# Patient Record
Sex: Male | Born: 1940 | Race: White | Hispanic: No | Smoking: Former smoker
Health system: Southern US, Community
[De-identification: ages and names within clinical notes are randomized; demographics above are authoritative.]

## PROBLEM LIST (undated history)

## (undated) DIAGNOSIS — C801 Malignant (primary) neoplasm, unspecified: Secondary | ICD-10-CM

## (undated) DIAGNOSIS — E119 Type 2 diabetes mellitus without complications: Secondary | ICD-10-CM

---

## 2014-03-08 DIAGNOSIS — E119 Type 2 diabetes mellitus without complications: Secondary | ICD-10-CM | POA: Diagnosis not present

## 2014-03-08 DIAGNOSIS — H2513 Age-related nuclear cataract, bilateral: Secondary | ICD-10-CM | POA: Diagnosis not present

## 2014-03-08 DIAGNOSIS — H2512 Age-related nuclear cataract, left eye: Secondary | ICD-10-CM | POA: Diagnosis not present

## 2014-03-18 DIAGNOSIS — H2512 Age-related nuclear cataract, left eye: Secondary | ICD-10-CM | POA: Diagnosis not present

## 2014-04-17 DIAGNOSIS — H2511 Age-related nuclear cataract, right eye: Secondary | ICD-10-CM | POA: Diagnosis not present

## 2014-04-17 DIAGNOSIS — H25011 Cortical age-related cataract, right eye: Secondary | ICD-10-CM | POA: Diagnosis not present

## 2014-04-18 DIAGNOSIS — L718 Other rosacea: Secondary | ICD-10-CM | POA: Diagnosis not present

## 2014-05-20 DIAGNOSIS — H2511 Age-related nuclear cataract, right eye: Secondary | ICD-10-CM | POA: Diagnosis not present

## 2014-06-25 DIAGNOSIS — M179 Osteoarthritis of knee, unspecified: Secondary | ICD-10-CM | POA: Diagnosis not present

## 2014-07-09 DIAGNOSIS — G6289 Other specified polyneuropathies: Secondary | ICD-10-CM | POA: Diagnosis not present

## 2014-07-09 DIAGNOSIS — I10 Essential (primary) hypertension: Secondary | ICD-10-CM | POA: Diagnosis not present

## 2014-07-09 DIAGNOSIS — E1165 Type 2 diabetes mellitus with hyperglycemia: Secondary | ICD-10-CM | POA: Diagnosis not present

## 2014-07-09 DIAGNOSIS — D519 Vitamin B12 deficiency anemia, unspecified: Secondary | ICD-10-CM | POA: Diagnosis not present

## 2014-07-14 DIAGNOSIS — D519 Vitamin B12 deficiency anemia, unspecified: Secondary | ICD-10-CM | POA: Diagnosis not present

## 2014-07-14 DIAGNOSIS — Z1389 Encounter for screening for other disorder: Secondary | ICD-10-CM | POA: Diagnosis not present

## 2014-07-14 DIAGNOSIS — E1165 Type 2 diabetes mellitus with hyperglycemia: Secondary | ICD-10-CM | POA: Diagnosis not present

## 2014-11-11 DIAGNOSIS — J019 Acute sinusitis, unspecified: Secondary | ICD-10-CM | POA: Diagnosis not present

## 2014-11-11 DIAGNOSIS — E1165 Type 2 diabetes mellitus with hyperglycemia: Secondary | ICD-10-CM | POA: Diagnosis not present

## 2014-11-11 DIAGNOSIS — I10 Essential (primary) hypertension: Secondary | ICD-10-CM | POA: Diagnosis not present

## 2014-11-25 DIAGNOSIS — L609 Nail disorder, unspecified: Secondary | ICD-10-CM | POA: Diagnosis not present

## 2014-11-25 DIAGNOSIS — E1140 Type 2 diabetes mellitus with diabetic neuropathy, unspecified: Secondary | ICD-10-CM | POA: Diagnosis not present

## 2014-11-25 DIAGNOSIS — L110 Acquired keratosis follicularis: Secondary | ICD-10-CM | POA: Diagnosis not present

## 2014-12-30 DIAGNOSIS — D519 Vitamin B12 deficiency anemia, unspecified: Secondary | ICD-10-CM | POA: Diagnosis not present

## 2014-12-30 DIAGNOSIS — Z0000 Encounter for general adult medical examination without abnormal findings: Secondary | ICD-10-CM | POA: Diagnosis not present

## 2014-12-30 DIAGNOSIS — L739 Follicular disorder, unspecified: Secondary | ICD-10-CM | POA: Diagnosis not present

## 2014-12-30 DIAGNOSIS — E1165 Type 2 diabetes mellitus with hyperglycemia: Secondary | ICD-10-CM | POA: Diagnosis not present

## 2014-12-30 DIAGNOSIS — G6289 Other specified polyneuropathies: Secondary | ICD-10-CM | POA: Diagnosis not present

## 2015-01-01 DIAGNOSIS — M179 Osteoarthritis of knee, unspecified: Secondary | ICD-10-CM | POA: Diagnosis not present

## 2015-05-18 DIAGNOSIS — M179 Osteoarthritis of knee, unspecified: Secondary | ICD-10-CM | POA: Diagnosis not present

## 2015-06-17 DIAGNOSIS — E1165 Type 2 diabetes mellitus with hyperglycemia: Secondary | ICD-10-CM | POA: Diagnosis not present

## 2015-06-17 DIAGNOSIS — D519 Vitamin B12 deficiency anemia, unspecified: Secondary | ICD-10-CM | POA: Diagnosis not present

## 2015-06-17 DIAGNOSIS — I10 Essential (primary) hypertension: Secondary | ICD-10-CM | POA: Diagnosis not present

## 2015-06-24 DIAGNOSIS — I10 Essential (primary) hypertension: Secondary | ICD-10-CM | POA: Diagnosis not present

## 2015-06-24 DIAGNOSIS — E1165 Type 2 diabetes mellitus with hyperglycemia: Secondary | ICD-10-CM | POA: Diagnosis not present

## 2015-06-24 DIAGNOSIS — D519 Vitamin B12 deficiency anemia, unspecified: Secondary | ICD-10-CM | POA: Diagnosis not present

## 2015-10-07 DIAGNOSIS — R3916 Straining to void: Secondary | ICD-10-CM | POA: Diagnosis not present

## 2015-10-07 DIAGNOSIS — R972 Elevated prostate specific antigen [PSA]: Secondary | ICD-10-CM | POA: Diagnosis not present

## 2015-10-07 DIAGNOSIS — R3912 Poor urinary stream: Secondary | ICD-10-CM | POA: Diagnosis not present

## 2015-11-10 DIAGNOSIS — R972 Elevated prostate specific antigen [PSA]: Secondary | ICD-10-CM | POA: Diagnosis not present

## 2015-11-20 DIAGNOSIS — R3912 Poor urinary stream: Secondary | ICD-10-CM | POA: Diagnosis not present

## 2015-11-20 DIAGNOSIS — R972 Elevated prostate specific antigen [PSA]: Secondary | ICD-10-CM | POA: Diagnosis not present

## 2015-11-24 DIAGNOSIS — E1140 Type 2 diabetes mellitus with diabetic neuropathy, unspecified: Secondary | ICD-10-CM | POA: Diagnosis not present

## 2015-11-24 DIAGNOSIS — L110 Acquired keratosis follicularis: Secondary | ICD-10-CM | POA: Diagnosis not present

## 2015-11-24 DIAGNOSIS — L609 Nail disorder, unspecified: Secondary | ICD-10-CM | POA: Diagnosis not present

## 2016-01-21 DIAGNOSIS — C61 Malignant neoplasm of prostate: Secondary | ICD-10-CM | POA: Diagnosis not present

## 2016-01-21 DIAGNOSIS — R972 Elevated prostate specific antigen [PSA]: Secondary | ICD-10-CM | POA: Diagnosis not present

## 2016-02-03 DIAGNOSIS — L110 Acquired keratosis follicularis: Secondary | ICD-10-CM | POA: Diagnosis not present

## 2016-02-03 DIAGNOSIS — L609 Nail disorder, unspecified: Secondary | ICD-10-CM | POA: Diagnosis not present

## 2016-02-03 DIAGNOSIS — E1140 Type 2 diabetes mellitus with diabetic neuropathy, unspecified: Secondary | ICD-10-CM | POA: Diagnosis not present

## 2016-02-04 DIAGNOSIS — R972 Elevated prostate specific antigen [PSA]: Secondary | ICD-10-CM | POA: Diagnosis not present

## 2016-02-04 DIAGNOSIS — C61 Malignant neoplasm of prostate: Secondary | ICD-10-CM | POA: Diagnosis not present

## 2016-02-12 DIAGNOSIS — D519 Vitamin B12 deficiency anemia, unspecified: Secondary | ICD-10-CM | POA: Diagnosis not present

## 2016-02-12 DIAGNOSIS — I10 Essential (primary) hypertension: Secondary | ICD-10-CM | POA: Diagnosis not present

## 2016-02-12 DIAGNOSIS — E1165 Type 2 diabetes mellitus with hyperglycemia: Secondary | ICD-10-CM | POA: Diagnosis not present

## 2016-02-12 DIAGNOSIS — G6289 Other specified polyneuropathies: Secondary | ICD-10-CM | POA: Diagnosis not present

## 2016-02-16 DIAGNOSIS — Z23 Encounter for immunization: Secondary | ICD-10-CM | POA: Diagnosis not present

## 2016-02-16 DIAGNOSIS — Z0000 Encounter for general adult medical examination without abnormal findings: Secondary | ICD-10-CM | POA: Diagnosis not present

## 2016-02-17 DIAGNOSIS — L729 Follicular cyst of the skin and subcutaneous tissue, unspecified: Secondary | ICD-10-CM | POA: Diagnosis not present

## 2016-02-19 DIAGNOSIS — L989 Disorder of the skin and subcutaneous tissue, unspecified: Secondary | ICD-10-CM | POA: Diagnosis not present

## 2016-02-19 DIAGNOSIS — C4442 Squamous cell carcinoma of skin of scalp and neck: Secondary | ICD-10-CM | POA: Diagnosis not present

## 2016-02-26 DIAGNOSIS — C61 Malignant neoplasm of prostate: Secondary | ICD-10-CM | POA: Diagnosis not present

## 2016-03-03 DIAGNOSIS — Z7983 Long term (current) use of bisphosphonates: Secondary | ICD-10-CM | POA: Diagnosis not present

## 2016-03-03 DIAGNOSIS — Z8042 Family history of malignant neoplasm of prostate: Secondary | ICD-10-CM | POA: Diagnosis not present

## 2016-03-03 DIAGNOSIS — Z800 Family history of malignant neoplasm of digestive organs: Secondary | ICD-10-CM | POA: Diagnosis not present

## 2016-03-03 DIAGNOSIS — Z79899 Other long term (current) drug therapy: Secondary | ICD-10-CM | POA: Diagnosis not present

## 2016-03-03 DIAGNOSIS — E119 Type 2 diabetes mellitus without complications: Secondary | ICD-10-CM | POA: Diagnosis not present

## 2016-03-03 DIAGNOSIS — Z87891 Personal history of nicotine dependence: Secondary | ICD-10-CM | POA: Diagnosis not present

## 2016-03-03 DIAGNOSIS — G4733 Obstructive sleep apnea (adult) (pediatric): Secondary | ICD-10-CM | POA: Diagnosis not present

## 2016-03-03 DIAGNOSIS — C61 Malignant neoplasm of prostate: Secondary | ICD-10-CM | POA: Diagnosis not present

## 2016-03-03 DIAGNOSIS — Z7984 Long term (current) use of oral hypoglycemic drugs: Secondary | ICD-10-CM | POA: Diagnosis not present

## 2016-03-09 DIAGNOSIS — C61 Malignant neoplasm of prostate: Secondary | ICD-10-CM | POA: Diagnosis not present

## 2016-03-29 DIAGNOSIS — C61 Malignant neoplasm of prostate: Secondary | ICD-10-CM | POA: Diagnosis not present

## 2016-04-06 DIAGNOSIS — C61 Malignant neoplasm of prostate: Secondary | ICD-10-CM | POA: Diagnosis not present

## 2016-04-12 DIAGNOSIS — C61 Malignant neoplasm of prostate: Secondary | ICD-10-CM | POA: Diagnosis not present

## 2016-04-12 DIAGNOSIS — Z510 Encounter for antineoplastic radiation therapy: Secondary | ICD-10-CM | POA: Diagnosis not present

## 2016-04-14 DIAGNOSIS — C61 Malignant neoplasm of prostate: Secondary | ICD-10-CM | POA: Diagnosis not present

## 2016-04-14 DIAGNOSIS — Z510 Encounter for antineoplastic radiation therapy: Secondary | ICD-10-CM | POA: Diagnosis not present

## 2016-04-15 DIAGNOSIS — C61 Malignant neoplasm of prostate: Secondary | ICD-10-CM | POA: Diagnosis not present

## 2016-04-15 DIAGNOSIS — Z510 Encounter for antineoplastic radiation therapy: Secondary | ICD-10-CM | POA: Diagnosis not present

## 2016-04-18 DIAGNOSIS — C61 Malignant neoplasm of prostate: Secondary | ICD-10-CM | POA: Diagnosis not present

## 2016-04-18 DIAGNOSIS — Z510 Encounter for antineoplastic radiation therapy: Secondary | ICD-10-CM | POA: Diagnosis not present

## 2016-04-19 DIAGNOSIS — Z510 Encounter for antineoplastic radiation therapy: Secondary | ICD-10-CM | POA: Diagnosis not present

## 2016-04-19 DIAGNOSIS — C61 Malignant neoplasm of prostate: Secondary | ICD-10-CM | POA: Diagnosis not present

## 2016-04-20 DIAGNOSIS — Z510 Encounter for antineoplastic radiation therapy: Secondary | ICD-10-CM | POA: Diagnosis not present

## 2016-04-20 DIAGNOSIS — C61 Malignant neoplasm of prostate: Secondary | ICD-10-CM | POA: Diagnosis not present

## 2016-04-20 DIAGNOSIS — L609 Nail disorder, unspecified: Secondary | ICD-10-CM | POA: Diagnosis not present

## 2016-04-20 DIAGNOSIS — L110 Acquired keratosis follicularis: Secondary | ICD-10-CM | POA: Diagnosis not present

## 2016-04-20 DIAGNOSIS — E1140 Type 2 diabetes mellitus with diabetic neuropathy, unspecified: Secondary | ICD-10-CM | POA: Diagnosis not present

## 2016-04-21 DIAGNOSIS — Z510 Encounter for antineoplastic radiation therapy: Secondary | ICD-10-CM | POA: Diagnosis not present

## 2016-04-21 DIAGNOSIS — C61 Malignant neoplasm of prostate: Secondary | ICD-10-CM | POA: Diagnosis not present

## 2016-04-22 DIAGNOSIS — C61 Malignant neoplasm of prostate: Secondary | ICD-10-CM | POA: Diagnosis not present

## 2016-04-22 DIAGNOSIS — Z510 Encounter for antineoplastic radiation therapy: Secondary | ICD-10-CM | POA: Diagnosis not present

## 2016-04-25 DIAGNOSIS — C61 Malignant neoplasm of prostate: Secondary | ICD-10-CM | POA: Diagnosis not present

## 2016-04-25 DIAGNOSIS — Z510 Encounter for antineoplastic radiation therapy: Secondary | ICD-10-CM | POA: Diagnosis not present

## 2016-04-26 DIAGNOSIS — C61 Malignant neoplasm of prostate: Secondary | ICD-10-CM | POA: Diagnosis not present

## 2016-04-26 DIAGNOSIS — Z510 Encounter for antineoplastic radiation therapy: Secondary | ICD-10-CM | POA: Diagnosis not present

## 2016-04-26 DIAGNOSIS — H04123 Dry eye syndrome of bilateral lacrimal glands: Secondary | ICD-10-CM | POA: Diagnosis not present

## 2016-04-27 DIAGNOSIS — C61 Malignant neoplasm of prostate: Secondary | ICD-10-CM | POA: Diagnosis not present

## 2016-04-27 DIAGNOSIS — Z510 Encounter for antineoplastic radiation therapy: Secondary | ICD-10-CM | POA: Diagnosis not present

## 2016-04-28 DIAGNOSIS — Z510 Encounter for antineoplastic radiation therapy: Secondary | ICD-10-CM | POA: Diagnosis not present

## 2016-04-28 DIAGNOSIS — C61 Malignant neoplasm of prostate: Secondary | ICD-10-CM | POA: Diagnosis not present

## 2016-04-29 DIAGNOSIS — Z510 Encounter for antineoplastic radiation therapy: Secondary | ICD-10-CM | POA: Diagnosis not present

## 2016-04-29 DIAGNOSIS — C61 Malignant neoplasm of prostate: Secondary | ICD-10-CM | POA: Diagnosis not present

## 2016-05-02 DIAGNOSIS — C61 Malignant neoplasm of prostate: Secondary | ICD-10-CM | POA: Diagnosis not present

## 2016-05-02 DIAGNOSIS — Z510 Encounter for antineoplastic radiation therapy: Secondary | ICD-10-CM | POA: Diagnosis not present

## 2016-05-03 DIAGNOSIS — Z510 Encounter for antineoplastic radiation therapy: Secondary | ICD-10-CM | POA: Diagnosis not present

## 2016-05-03 DIAGNOSIS — C61 Malignant neoplasm of prostate: Secondary | ICD-10-CM | POA: Diagnosis not present

## 2016-05-04 DIAGNOSIS — Z510 Encounter for antineoplastic radiation therapy: Secondary | ICD-10-CM | POA: Diagnosis not present

## 2016-05-04 DIAGNOSIS — C61 Malignant neoplasm of prostate: Secondary | ICD-10-CM | POA: Diagnosis not present

## 2016-05-05 DIAGNOSIS — Z510 Encounter for antineoplastic radiation therapy: Secondary | ICD-10-CM | POA: Diagnosis not present

## 2016-05-05 DIAGNOSIS — C61 Malignant neoplasm of prostate: Secondary | ICD-10-CM | POA: Diagnosis not present

## 2016-05-06 DIAGNOSIS — Z510 Encounter for antineoplastic radiation therapy: Secondary | ICD-10-CM | POA: Diagnosis not present

## 2016-05-06 DIAGNOSIS — C61 Malignant neoplasm of prostate: Secondary | ICD-10-CM | POA: Diagnosis not present

## 2016-05-09 DIAGNOSIS — Z510 Encounter for antineoplastic radiation therapy: Secondary | ICD-10-CM | POA: Diagnosis not present

## 2016-05-09 DIAGNOSIS — C61 Malignant neoplasm of prostate: Secondary | ICD-10-CM | POA: Diagnosis not present

## 2016-05-10 DIAGNOSIS — Z510 Encounter for antineoplastic radiation therapy: Secondary | ICD-10-CM | POA: Diagnosis not present

## 2016-05-10 DIAGNOSIS — C61 Malignant neoplasm of prostate: Secondary | ICD-10-CM | POA: Diagnosis not present

## 2016-05-11 DIAGNOSIS — C61 Malignant neoplasm of prostate: Secondary | ICD-10-CM | POA: Diagnosis not present

## 2016-05-11 DIAGNOSIS — Z510 Encounter for antineoplastic radiation therapy: Secondary | ICD-10-CM | POA: Diagnosis not present

## 2016-05-12 DIAGNOSIS — Z510 Encounter for antineoplastic radiation therapy: Secondary | ICD-10-CM | POA: Diagnosis not present

## 2016-05-12 DIAGNOSIS — C61 Malignant neoplasm of prostate: Secondary | ICD-10-CM | POA: Diagnosis not present

## 2016-05-13 DIAGNOSIS — C61 Malignant neoplasm of prostate: Secondary | ICD-10-CM | POA: Diagnosis not present

## 2016-05-13 DIAGNOSIS — Z510 Encounter for antineoplastic radiation therapy: Secondary | ICD-10-CM | POA: Diagnosis not present

## 2016-05-16 DIAGNOSIS — Z510 Encounter for antineoplastic radiation therapy: Secondary | ICD-10-CM | POA: Diagnosis not present

## 2016-05-16 DIAGNOSIS — C61 Malignant neoplasm of prostate: Secondary | ICD-10-CM | POA: Diagnosis not present

## 2016-05-17 DIAGNOSIS — Z510 Encounter for antineoplastic radiation therapy: Secondary | ICD-10-CM | POA: Diagnosis not present

## 2016-05-17 DIAGNOSIS — C61 Malignant neoplasm of prostate: Secondary | ICD-10-CM | POA: Diagnosis not present

## 2016-05-18 DIAGNOSIS — C61 Malignant neoplasm of prostate: Secondary | ICD-10-CM | POA: Diagnosis not present

## 2016-05-18 DIAGNOSIS — Z510 Encounter for antineoplastic radiation therapy: Secondary | ICD-10-CM | POA: Diagnosis not present

## 2016-05-19 DIAGNOSIS — Z510 Encounter for antineoplastic radiation therapy: Secondary | ICD-10-CM | POA: Diagnosis not present

## 2016-05-19 DIAGNOSIS — C61 Malignant neoplasm of prostate: Secondary | ICD-10-CM | POA: Diagnosis not present

## 2016-05-20 DIAGNOSIS — Z510 Encounter for antineoplastic radiation therapy: Secondary | ICD-10-CM | POA: Diagnosis not present

## 2016-05-20 DIAGNOSIS — C61 Malignant neoplasm of prostate: Secondary | ICD-10-CM | POA: Diagnosis not present

## 2016-05-23 DIAGNOSIS — Z510 Encounter for antineoplastic radiation therapy: Secondary | ICD-10-CM | POA: Diagnosis not present

## 2016-05-23 DIAGNOSIS — C61 Malignant neoplasm of prostate: Secondary | ICD-10-CM | POA: Diagnosis not present

## 2016-05-24 DIAGNOSIS — Z510 Encounter for antineoplastic radiation therapy: Secondary | ICD-10-CM | POA: Diagnosis not present

## 2016-05-24 DIAGNOSIS — C61 Malignant neoplasm of prostate: Secondary | ICD-10-CM | POA: Diagnosis not present

## 2016-05-25 DIAGNOSIS — C61 Malignant neoplasm of prostate: Secondary | ICD-10-CM | POA: Diagnosis not present

## 2016-05-25 DIAGNOSIS — M179 Osteoarthritis of knee, unspecified: Secondary | ICD-10-CM | POA: Diagnosis not present

## 2016-05-25 DIAGNOSIS — Z510 Encounter for antineoplastic radiation therapy: Secondary | ICD-10-CM | POA: Diagnosis not present

## 2016-05-25 DIAGNOSIS — M25561 Pain in right knee: Secondary | ICD-10-CM | POA: Diagnosis not present

## 2016-05-26 DIAGNOSIS — Z510 Encounter for antineoplastic radiation therapy: Secondary | ICD-10-CM | POA: Diagnosis not present

## 2016-05-26 DIAGNOSIS — C61 Malignant neoplasm of prostate: Secondary | ICD-10-CM | POA: Diagnosis not present

## 2016-05-27 DIAGNOSIS — Z510 Encounter for antineoplastic radiation therapy: Secondary | ICD-10-CM | POA: Diagnosis not present

## 2016-05-27 DIAGNOSIS — C61 Malignant neoplasm of prostate: Secondary | ICD-10-CM | POA: Diagnosis not present

## 2016-05-30 DIAGNOSIS — Z510 Encounter for antineoplastic radiation therapy: Secondary | ICD-10-CM | POA: Diagnosis not present

## 2016-05-30 DIAGNOSIS — C61 Malignant neoplasm of prostate: Secondary | ICD-10-CM | POA: Diagnosis not present

## 2016-06-01 DIAGNOSIS — C61 Malignant neoplasm of prostate: Secondary | ICD-10-CM | POA: Diagnosis not present

## 2016-06-01 DIAGNOSIS — Z510 Encounter for antineoplastic radiation therapy: Secondary | ICD-10-CM | POA: Diagnosis not present

## 2016-06-02 DIAGNOSIS — C61 Malignant neoplasm of prostate: Secondary | ICD-10-CM | POA: Diagnosis not present

## 2016-06-02 DIAGNOSIS — Z510 Encounter for antineoplastic radiation therapy: Secondary | ICD-10-CM | POA: Diagnosis not present

## 2016-06-06 DIAGNOSIS — C61 Malignant neoplasm of prostate: Secondary | ICD-10-CM | POA: Diagnosis not present

## 2016-06-06 DIAGNOSIS — Z510 Encounter for antineoplastic radiation therapy: Secondary | ICD-10-CM | POA: Diagnosis not present

## 2016-06-07 DIAGNOSIS — C61 Malignant neoplasm of prostate: Secondary | ICD-10-CM | POA: Diagnosis not present

## 2016-06-07 DIAGNOSIS — Z510 Encounter for antineoplastic radiation therapy: Secondary | ICD-10-CM | POA: Diagnosis not present

## 2016-06-08 DIAGNOSIS — Z510 Encounter for antineoplastic radiation therapy: Secondary | ICD-10-CM | POA: Diagnosis not present

## 2016-06-08 DIAGNOSIS — C61 Malignant neoplasm of prostate: Secondary | ICD-10-CM | POA: Diagnosis not present

## 2016-07-08 DIAGNOSIS — Z923 Personal history of irradiation: Secondary | ICD-10-CM | POA: Diagnosis not present

## 2016-07-08 DIAGNOSIS — C61 Malignant neoplasm of prostate: Secondary | ICD-10-CM | POA: Diagnosis not present

## 2016-07-13 ENCOUNTER — Ambulatory Visit (INDEPENDENT_AMBULATORY_CARE_PROVIDER_SITE_OTHER): Payer: Medicare Other | Admitting: Urology

## 2016-07-13 DIAGNOSIS — C61 Malignant neoplasm of prostate: Secondary | ICD-10-CM | POA: Diagnosis not present

## 2016-07-13 DIAGNOSIS — R351 Nocturia: Secondary | ICD-10-CM | POA: Diagnosis not present

## 2016-10-26 DIAGNOSIS — C61 Malignant neoplasm of prostate: Secondary | ICD-10-CM | POA: Diagnosis not present

## 2016-11-08 DIAGNOSIS — C61 Malignant neoplasm of prostate: Secondary | ICD-10-CM | POA: Insufficient documentation

## 2016-11-09 DIAGNOSIS — Z923 Personal history of irradiation: Secondary | ICD-10-CM | POA: Diagnosis not present

## 2016-11-09 DIAGNOSIS — C61 Malignant neoplasm of prostate: Secondary | ICD-10-CM | POA: Diagnosis not present

## 2017-01-03 DIAGNOSIS — M1711 Unilateral primary osteoarthritis, right knee: Secondary | ICD-10-CM | POA: Insufficient documentation

## 2017-01-25 ENCOUNTER — Ambulatory Visit: Payer: Medicare Other | Admitting: Urology

## 2017-01-25 DIAGNOSIS — N5201 Erectile dysfunction due to arterial insufficiency: Secondary | ICD-10-CM

## 2017-01-25 DIAGNOSIS — C61 Malignant neoplasm of prostate: Secondary | ICD-10-CM

## 2017-01-25 DIAGNOSIS — R351 Nocturia: Secondary | ICD-10-CM | POA: Diagnosis not present

## 2017-04-21 DIAGNOSIS — D519 Vitamin B12 deficiency anemia, unspecified: Secondary | ICD-10-CM | POA: Diagnosis not present

## 2017-04-21 DIAGNOSIS — C61 Malignant neoplasm of prostate: Secondary | ICD-10-CM | POA: Diagnosis not present

## 2017-04-21 DIAGNOSIS — I10 Essential (primary) hypertension: Secondary | ICD-10-CM | POA: Diagnosis not present

## 2017-04-21 DIAGNOSIS — L739 Follicular disorder, unspecified: Secondary | ICD-10-CM | POA: Diagnosis not present

## 2017-04-21 DIAGNOSIS — E1165 Type 2 diabetes mellitus with hyperglycemia: Secondary | ICD-10-CM | POA: Diagnosis not present

## 2017-04-26 DIAGNOSIS — Z0001 Encounter for general adult medical examination with abnormal findings: Secondary | ICD-10-CM | POA: Diagnosis not present

## 2017-04-26 DIAGNOSIS — I10 Essential (primary) hypertension: Secondary | ICD-10-CM | POA: Diagnosis not present

## 2017-04-26 DIAGNOSIS — E1165 Type 2 diabetes mellitus with hyperglycemia: Secondary | ICD-10-CM | POA: Diagnosis not present

## 2017-04-26 DIAGNOSIS — C61 Malignant neoplasm of prostate: Secondary | ICD-10-CM | POA: Diagnosis not present

## 2017-05-03 ENCOUNTER — Ambulatory Visit: Payer: Medicare Other | Admitting: Urology

## 2017-05-03 DIAGNOSIS — R351 Nocturia: Secondary | ICD-10-CM

## 2017-05-03 DIAGNOSIS — C61 Malignant neoplasm of prostate: Secondary | ICD-10-CM | POA: Diagnosis not present

## 2017-05-03 DIAGNOSIS — N5201 Erectile dysfunction due to arterial insufficiency: Secondary | ICD-10-CM

## 2017-05-12 DIAGNOSIS — C61 Malignant neoplasm of prostate: Secondary | ICD-10-CM | POA: Diagnosis not present

## 2017-05-12 DIAGNOSIS — Z923 Personal history of irradiation: Secondary | ICD-10-CM | POA: Diagnosis not present

## 2017-07-12 ENCOUNTER — Ambulatory Visit: Payer: Medicare Other | Admitting: Urology

## 2017-10-24 DIAGNOSIS — L720 Epidermal cyst: Secondary | ICD-10-CM | POA: Diagnosis not present

## 2017-12-12 DIAGNOSIS — I10 Essential (primary) hypertension: Secondary | ICD-10-CM | POA: Diagnosis not present

## 2017-12-12 DIAGNOSIS — D519 Vitamin B12 deficiency anemia, unspecified: Secondary | ICD-10-CM | POA: Diagnosis not present

## 2017-12-12 DIAGNOSIS — R739 Hyperglycemia, unspecified: Secondary | ICD-10-CM | POA: Diagnosis not present

## 2017-12-12 DIAGNOSIS — E1165 Type 2 diabetes mellitus with hyperglycemia: Secondary | ICD-10-CM | POA: Diagnosis not present

## 2018-01-03 DIAGNOSIS — L609 Nail disorder, unspecified: Secondary | ICD-10-CM | POA: Diagnosis not present

## 2018-01-03 DIAGNOSIS — E1140 Type 2 diabetes mellitus with diabetic neuropathy, unspecified: Secondary | ICD-10-CM | POA: Diagnosis not present

## 2018-01-03 DIAGNOSIS — L110 Acquired keratosis follicularis: Secondary | ICD-10-CM | POA: Diagnosis not present

## 2018-01-11 DIAGNOSIS — M1711 Unilateral primary osteoarthritis, right knee: Secondary | ICD-10-CM | POA: Diagnosis not present

## 2018-01-16 DIAGNOSIS — Z23 Encounter for immunization: Secondary | ICD-10-CM | POA: Diagnosis not present

## 2018-01-16 DIAGNOSIS — E1165 Type 2 diabetes mellitus with hyperglycemia: Secondary | ICD-10-CM | POA: Diagnosis not present

## 2018-01-16 DIAGNOSIS — I10 Essential (primary) hypertension: Secondary | ICD-10-CM | POA: Diagnosis not present

## 2018-01-16 DIAGNOSIS — D519 Vitamin B12 deficiency anemia, unspecified: Secondary | ICD-10-CM | POA: Diagnosis not present

## 2018-01-16 DIAGNOSIS — Z1389 Encounter for screening for other disorder: Secondary | ICD-10-CM | POA: Diagnosis not present

## 2018-03-14 ENCOUNTER — Ambulatory Visit: Payer: Medicare Other | Admitting: Urology

## 2018-03-14 DIAGNOSIS — R351 Nocturia: Secondary | ICD-10-CM | POA: Diagnosis not present

## 2018-03-14 DIAGNOSIS — C61 Malignant neoplasm of prostate: Secondary | ICD-10-CM | POA: Diagnosis not present

## 2018-05-10 DIAGNOSIS — E1165 Type 2 diabetes mellitus with hyperglycemia: Secondary | ICD-10-CM | POA: Diagnosis not present

## 2018-05-10 DIAGNOSIS — Z13220 Encounter for screening for lipoid disorders: Secondary | ICD-10-CM | POA: Diagnosis not present

## 2018-05-10 DIAGNOSIS — I10 Essential (primary) hypertension: Secondary | ICD-10-CM | POA: Diagnosis not present

## 2018-05-15 DIAGNOSIS — D519 Vitamin B12 deficiency anemia, unspecified: Secondary | ICD-10-CM | POA: Diagnosis not present

## 2018-05-15 DIAGNOSIS — I10 Essential (primary) hypertension: Secondary | ICD-10-CM | POA: Diagnosis not present

## 2018-05-15 DIAGNOSIS — E1165 Type 2 diabetes mellitus with hyperglycemia: Secondary | ICD-10-CM | POA: Diagnosis not present

## 2018-05-15 DIAGNOSIS — M545 Low back pain: Secondary | ICD-10-CM | POA: Diagnosis not present

## 2018-05-21 ENCOUNTER — Other Ambulatory Visit: Payer: Self-pay

## 2018-05-21 NOTE — Patient Outreach (Signed)
[facility]) Care Management  05/21/2018  TERI DILTZ 1940/05/09 [id number]   Medication Adherence call to Mr. Quinnton Bury left a message for patient to call back patient is due on Jardiance 25 mg. Mr. Mian is showing past due under [place].   [place] Management Direct Dial [phone]  Fax [phone] [email or web address]

## 2018-06-11 ENCOUNTER — Other Ambulatory Visit: Payer: Self-pay

## 2018-06-11 NOTE — Patient Outreach (Signed)
[facility]) Care Management  06/11/2018  [name] 12-11-1940 [id number]   Medication Adherence call to Mr. Paul Clay spoke with patient he is due on Jardiance 25 mg patient is past due by 18 days he explain he has a prescription ready at the pharmacy and has not pick up he said his copay is too high,inform him of patients asisstance  Patient will be refer to Paul Clay (rph) [name] is showing past due under [place].   [place] Management Direct Dial [phone]  Fax [phone] [email or web address]

## 2018-06-18 ENCOUNTER — Other Ambulatory Visit: Payer: Self-pay | Admitting: Pharmacist

## 2018-06-18 NOTE — Patient Outreach (Signed)
  [name]) Care Management   06/18/2018  Paul Clay [birth year] [id number]  Reason for referral: Medication Assistance with empagliflozin  Referral source: pharmacy technician Current insurance: [place]  Outreach:  Successful telephone call with patient.  HIPAA identifiers verified and he consents to call verbally.   Subjective:   Patient reports he is able to review medications over the phone and reports he obtains 90 day supplies of most medications from [place] with exception of empagliflozin which he gets 1 month at a time.    He states his primary care provider is now Dr Quintin Alto at [facility] in [place].    He denies other medication related concerns aside from cost of empagliflozin.     Medications Reviewed Today    Reviewed by Lin Givens, [facility] (Pharmacist) on 06/18/18 at 15  Med List Status: <None>  Medication Order Taking? Sig Documenting Provider Last Dose Status Informant  B Complex Vitamins (VITAMIN B-COMPLEX PO) [id number] Yes Take 1 tablet by mouth daily. [provider]  Active Self  empagliflozin (JARDIANCE) 25 MG TABS tablet [id number] Yes Take 25 mg by mouth daily. [provider]  Active Self  glipiZIDE (GLUCOTROL XL) 10 MG 24 hr tablet [id number] Yes Take 10 mg by mouth daily with breakfast. [provider]  Active Self  metFORMIN (GLUCOPHAGE-XR) 500 MG 24 hr tablet [id number] Yes Take 2,000 mg by mouth daily with breakfast. [provider]  Active Self  tamsulosin (FLOMAX) 0.4 MG CAPS capsule [id number] Yes Take 0.4 mg by mouth daily. [provider]  Active Self          Assessment:  Drugs sorted by system:  Endocrine: -glipizide ER 10 mg daily -empagliflozin (Jardiance) 25 mg daily -metformin ER 500 mg 4 tabs daily  Renal: -tamsulosin 0.4 mg daily   Vitamins/Minerals/Supplements: -vitamin B daily   Medication Assistance Findings:  Medication assistance  needs identified.   Extra Help:  Reviewed Extra Help requirements---patient believes he may be eligible.  He wishes to discuss with his daughter and have his daughter help him apply.  Offered to contact his daughter to review Extra Help application process and he denied.       Patient Assistance Programs: Jardiance made by [employer] - He will need to apply for Extra Help first based on his report of potential eligibility for Extra Help.    Plan:  1) note routed to primary care doctor, dr sasser 2) Consider initiation of a statin in patient with diabetes if clinically appropriate and no contraindications.  3) Will place follow-up call to patient in next 2 weeks to see if he has any further questions regarding Extra Help application.   Karrie Meres, PharmD, [place] [phone]

## 2018-07-16 DIAGNOSIS — M1711 Unilateral primary osteoarthritis, right knee: Secondary | ICD-10-CM | POA: Diagnosis not present

## 2018-07-20 ENCOUNTER — Other Ambulatory Visit: Payer: Self-pay | Admitting: Pharmacist

## 2018-07-20 NOTE — Patient Outreach (Signed)
[facility]) Care Management  07/20/2018  Paul Clay 09-Sep-1940 [id number]  Successful phone outreach to patient to follow-up on medication patient assistance needs.  HIPAA details verified by patient.    Patient reports that he has not worked on Extra Help application but his primary care doctor called him with some papers to help with the medication cost for Jardiance.    Patient reports he has no further needs from the care management pharmacist at this time and will work with his doctor.   Plan:  Case closed, patient aware he can contact care management pharmacist if new needs arise.   Karrie Meres, PharmD, [place] [phone]

## 2018-08-14 DIAGNOSIS — H4320 Crystalline deposits in vitreous body, unspecified eye: Secondary | ICD-10-CM | POA: Diagnosis not present

## 2018-08-28 DIAGNOSIS — D519 Vitamin B12 deficiency anemia, unspecified: Secondary | ICD-10-CM | POA: Diagnosis not present

## 2018-08-28 DIAGNOSIS — R739 Hyperglycemia, unspecified: Secondary | ICD-10-CM | POA: Diagnosis not present

## 2018-08-28 DIAGNOSIS — E1165 Type 2 diabetes mellitus with hyperglycemia: Secondary | ICD-10-CM | POA: Diagnosis not present

## 2018-08-28 DIAGNOSIS — I10 Essential (primary) hypertension: Secondary | ICD-10-CM | POA: Diagnosis not present

## 2018-08-31 DIAGNOSIS — I10 Essential (primary) hypertension: Secondary | ICD-10-CM | POA: Diagnosis not present

## 2018-08-31 DIAGNOSIS — Z0000 Encounter for general adult medical examination without abnormal findings: Secondary | ICD-10-CM | POA: Diagnosis not present

## 2018-08-31 DIAGNOSIS — Z23 Encounter for immunization: Secondary | ICD-10-CM | POA: Diagnosis not present

## 2018-08-31 DIAGNOSIS — E1165 Type 2 diabetes mellitus with hyperglycemia: Secondary | ICD-10-CM | POA: Diagnosis not present

## 2018-08-31 DIAGNOSIS — D519 Vitamin B12 deficiency anemia, unspecified: Secondary | ICD-10-CM | POA: Diagnosis not present

## 2018-11-07 DIAGNOSIS — L609 Nail disorder, unspecified: Secondary | ICD-10-CM | POA: Diagnosis not present

## 2018-11-07 DIAGNOSIS — L110 Acquired keratosis follicularis: Secondary | ICD-10-CM | POA: Diagnosis not present

## 2018-11-07 DIAGNOSIS — E1140 Type 2 diabetes mellitus with diabetic neuropathy, unspecified: Secondary | ICD-10-CM | POA: Diagnosis not present

## 2018-12-05 DIAGNOSIS — E1165 Type 2 diabetes mellitus with hyperglycemia: Secondary | ICD-10-CM | POA: Diagnosis not present

## 2018-12-05 DIAGNOSIS — I10 Essential (primary) hypertension: Secondary | ICD-10-CM | POA: Diagnosis not present

## 2018-12-20 DIAGNOSIS — Z1159 Encounter for screening for other viral diseases: Secondary | ICD-10-CM | POA: Diagnosis not present

## 2018-12-25 DIAGNOSIS — D519 Vitamin B12 deficiency anemia, unspecified: Secondary | ICD-10-CM | POA: Diagnosis not present

## 2018-12-25 DIAGNOSIS — M545 Low back pain: Secondary | ICD-10-CM | POA: Diagnosis not present

## 2018-12-25 DIAGNOSIS — R739 Hyperglycemia, unspecified: Secondary | ICD-10-CM | POA: Diagnosis not present

## 2018-12-25 DIAGNOSIS — E1165 Type 2 diabetes mellitus with hyperglycemia: Secondary | ICD-10-CM | POA: Diagnosis not present

## 2018-12-25 DIAGNOSIS — I10 Essential (primary) hypertension: Secondary | ICD-10-CM | POA: Diagnosis not present

## 2019-01-01 DIAGNOSIS — D519 Vitamin B12 deficiency anemia, unspecified: Secondary | ICD-10-CM | POA: Diagnosis not present

## 2019-01-01 DIAGNOSIS — N183 Chronic kidney disease, stage 3 unspecified: Secondary | ICD-10-CM | POA: Diagnosis not present

## 2019-01-01 DIAGNOSIS — E1165 Type 2 diabetes mellitus with hyperglycemia: Secondary | ICD-10-CM | POA: Diagnosis not present

## 2019-01-01 DIAGNOSIS — Z23 Encounter for immunization: Secondary | ICD-10-CM | POA: Diagnosis not present

## 2019-01-01 DIAGNOSIS — I10 Essential (primary) hypertension: Secondary | ICD-10-CM | POA: Diagnosis not present

## 2019-01-04 DIAGNOSIS — I10 Essential (primary) hypertension: Secondary | ICD-10-CM | POA: Diagnosis not present

## 2019-01-04 DIAGNOSIS — E1165 Type 2 diabetes mellitus with hyperglycemia: Secondary | ICD-10-CM | POA: Diagnosis not present

## 2019-01-16 DIAGNOSIS — L110 Acquired keratosis follicularis: Secondary | ICD-10-CM | POA: Diagnosis not present

## 2019-01-16 DIAGNOSIS — E1140 Type 2 diabetes mellitus with diabetic neuropathy, unspecified: Secondary | ICD-10-CM | POA: Diagnosis not present

## 2019-01-16 DIAGNOSIS — L609 Nail disorder, unspecified: Secondary | ICD-10-CM | POA: Diagnosis not present

## 2019-01-23 DIAGNOSIS — Z08 Encounter for follow-up examination after completed treatment for malignant neoplasm: Secondary | ICD-10-CM | POA: Diagnosis not present

## 2019-01-23 DIAGNOSIS — Z923 Personal history of irradiation: Secondary | ICD-10-CM | POA: Diagnosis not present

## 2019-03-07 DIAGNOSIS — I10 Essential (primary) hypertension: Secondary | ICD-10-CM | POA: Diagnosis not present

## 2019-03-07 DIAGNOSIS — E1165 Type 2 diabetes mellitus with hyperglycemia: Secondary | ICD-10-CM | POA: Diagnosis not present

## 2019-04-05 DIAGNOSIS — E1122 Type 2 diabetes mellitus with diabetic chronic kidney disease: Secondary | ICD-10-CM | POA: Diagnosis not present

## 2019-04-05 DIAGNOSIS — I10 Essential (primary) hypertension: Secondary | ICD-10-CM | POA: Diagnosis not present

## 2019-04-05 DIAGNOSIS — E1165 Type 2 diabetes mellitus with hyperglycemia: Secondary | ICD-10-CM | POA: Diagnosis not present

## 2019-04-10 DIAGNOSIS — L110 Acquired keratosis follicularis: Secondary | ICD-10-CM | POA: Diagnosis not present

## 2019-04-10 DIAGNOSIS — E1140 Type 2 diabetes mellitus with diabetic neuropathy, unspecified: Secondary | ICD-10-CM | POA: Diagnosis not present

## 2019-04-10 DIAGNOSIS — L609 Nail disorder, unspecified: Secondary | ICD-10-CM | POA: Diagnosis not present

## 2019-04-17 DIAGNOSIS — M1711 Unilateral primary osteoarthritis, right knee: Secondary | ICD-10-CM | POA: Diagnosis not present

## 2019-04-17 DIAGNOSIS — M5410 Radiculopathy, site unspecified: Secondary | ICD-10-CM | POA: Insufficient documentation

## 2019-04-17 DIAGNOSIS — M160 Bilateral primary osteoarthritis of hip: Secondary | ICD-10-CM | POA: Diagnosis not present

## 2019-04-23 DIAGNOSIS — M5441 Lumbago with sciatica, right side: Secondary | ICD-10-CM | POA: Diagnosis not present

## 2019-04-23 DIAGNOSIS — M47816 Spondylosis without myelopathy or radiculopathy, lumbar region: Secondary | ICD-10-CM | POA: Diagnosis not present

## 2019-04-23 DIAGNOSIS — M9902 Segmental and somatic dysfunction of thoracic region: Secondary | ICD-10-CM | POA: Diagnosis not present

## 2019-04-29 DIAGNOSIS — N183 Chronic kidney disease, stage 3 unspecified: Secondary | ICD-10-CM | POA: Diagnosis not present

## 2019-04-29 DIAGNOSIS — R739 Hyperglycemia, unspecified: Secondary | ICD-10-CM | POA: Diagnosis not present

## 2019-04-29 DIAGNOSIS — D519 Vitamin B12 deficiency anemia, unspecified: Secondary | ICD-10-CM | POA: Diagnosis not present

## 2019-04-29 DIAGNOSIS — M47816 Spondylosis without myelopathy or radiculopathy, lumbar region: Secondary | ICD-10-CM | POA: Diagnosis not present

## 2019-04-29 DIAGNOSIS — E1165 Type 2 diabetes mellitus with hyperglycemia: Secondary | ICD-10-CM | POA: Diagnosis not present

## 2019-04-29 DIAGNOSIS — M5441 Lumbago with sciatica, right side: Secondary | ICD-10-CM | POA: Diagnosis not present

## 2019-04-29 DIAGNOSIS — E1122 Type 2 diabetes mellitus with diabetic chronic kidney disease: Secondary | ICD-10-CM | POA: Diagnosis not present

## 2019-04-29 DIAGNOSIS — M9902 Segmental and somatic dysfunction of thoracic region: Secondary | ICD-10-CM | POA: Diagnosis not present

## 2019-04-29 DIAGNOSIS — I10 Essential (primary) hypertension: Secondary | ICD-10-CM | POA: Diagnosis not present

## 2019-05-01 DIAGNOSIS — M9902 Segmental and somatic dysfunction of thoracic region: Secondary | ICD-10-CM | POA: Diagnosis not present

## 2019-05-01 DIAGNOSIS — M47816 Spondylosis without myelopathy or radiculopathy, lumbar region: Secondary | ICD-10-CM | POA: Diagnosis not present

## 2019-05-01 DIAGNOSIS — M5441 Lumbago with sciatica, right side: Secondary | ICD-10-CM | POA: Diagnosis not present

## 2019-05-02 DIAGNOSIS — N183 Chronic kidney disease, stage 3 unspecified: Secondary | ICD-10-CM | POA: Diagnosis not present

## 2019-05-02 DIAGNOSIS — E1165 Type 2 diabetes mellitus with hyperglycemia: Secondary | ICD-10-CM | POA: Diagnosis not present

## 2019-05-02 DIAGNOSIS — D519 Vitamin B12 deficiency anemia, unspecified: Secondary | ICD-10-CM | POA: Diagnosis not present

## 2019-05-02 DIAGNOSIS — I10 Essential (primary) hypertension: Secondary | ICD-10-CM | POA: Diagnosis not present

## 2019-05-03 DIAGNOSIS — M47816 Spondylosis without myelopathy or radiculopathy, lumbar region: Secondary | ICD-10-CM | POA: Diagnosis not present

## 2019-05-03 DIAGNOSIS — I10 Essential (primary) hypertension: Secondary | ICD-10-CM | POA: Diagnosis not present

## 2019-05-03 DIAGNOSIS — M9902 Segmental and somatic dysfunction of thoracic region: Secondary | ICD-10-CM | POA: Diagnosis not present

## 2019-05-03 DIAGNOSIS — M5441 Lumbago with sciatica, right side: Secondary | ICD-10-CM | POA: Diagnosis not present

## 2019-05-03 DIAGNOSIS — E7849 Other hyperlipidemia: Secondary | ICD-10-CM | POA: Diagnosis not present

## 2019-05-07 DIAGNOSIS — M47816 Spondylosis without myelopathy or radiculopathy, lumbar region: Secondary | ICD-10-CM | POA: Diagnosis not present

## 2019-05-07 DIAGNOSIS — M5441 Lumbago with sciatica, right side: Secondary | ICD-10-CM | POA: Diagnosis not present

## 2019-05-07 DIAGNOSIS — M9902 Segmental and somatic dysfunction of thoracic region: Secondary | ICD-10-CM | POA: Diagnosis not present

## 2019-05-09 DIAGNOSIS — M9902 Segmental and somatic dysfunction of thoracic region: Secondary | ICD-10-CM | POA: Diagnosis not present

## 2019-05-09 DIAGNOSIS — M5441 Lumbago with sciatica, right side: Secondary | ICD-10-CM | POA: Diagnosis not present

## 2019-05-09 DIAGNOSIS — M47816 Spondylosis without myelopathy or radiculopathy, lumbar region: Secondary | ICD-10-CM | POA: Diagnosis not present

## 2019-05-13 DIAGNOSIS — E119 Type 2 diabetes mellitus without complications: Secondary | ICD-10-CM | POA: Diagnosis not present

## 2019-05-13 DIAGNOSIS — Z20822 Contact with and (suspected) exposure to covid-19: Secondary | ICD-10-CM | POA: Diagnosis not present

## 2019-05-13 DIAGNOSIS — Z79899 Other long term (current) drug therapy: Secondary | ICD-10-CM | POA: Diagnosis not present

## 2019-05-13 DIAGNOSIS — I10 Essential (primary) hypertension: Secondary | ICD-10-CM | POA: Diagnosis not present

## 2019-05-13 DIAGNOSIS — I442 Atrioventricular block, complete: Secondary | ICD-10-CM | POA: Diagnosis not present

## 2019-05-13 DIAGNOSIS — R918 Other nonspecific abnormal finding of lung field: Secondary | ICD-10-CM | POA: Diagnosis not present

## 2019-05-13 DIAGNOSIS — R0602 Shortness of breath: Secondary | ICD-10-CM | POA: Diagnosis not present

## 2019-05-13 DIAGNOSIS — Z7984 Long term (current) use of oral hypoglycemic drugs: Secondary | ICD-10-CM | POA: Diagnosis not present

## 2019-05-14 DIAGNOSIS — M160 Bilateral primary osteoarthritis of hip: Secondary | ICD-10-CM | POA: Diagnosis not present

## 2019-05-14 DIAGNOSIS — I10 Essential (primary) hypertension: Secondary | ICD-10-CM | POA: Diagnosis not present

## 2019-05-14 DIAGNOSIS — Z950 Presence of cardiac pacemaker: Secondary | ICD-10-CM | POA: Diagnosis not present

## 2019-05-14 DIAGNOSIS — Z79899 Other long term (current) drug therapy: Secondary | ICD-10-CM | POA: Diagnosis not present

## 2019-05-14 DIAGNOSIS — M1711 Unilateral primary osteoarthritis, right knee: Secondary | ICD-10-CM | POA: Diagnosis not present

## 2019-05-14 DIAGNOSIS — R0600 Dyspnea, unspecified: Secondary | ICD-10-CM | POA: Diagnosis not present

## 2019-05-14 DIAGNOSIS — R0609 Other forms of dyspnea: Secondary | ICD-10-CM | POA: Diagnosis not present

## 2019-05-14 DIAGNOSIS — Z20822 Contact with and (suspected) exposure to covid-19: Secondary | ICD-10-CM | POA: Diagnosis not present

## 2019-05-14 DIAGNOSIS — E785 Hyperlipidemia, unspecified: Secondary | ICD-10-CM | POA: Diagnosis not present

## 2019-05-14 DIAGNOSIS — I442 Atrioventricular block, complete: Secondary | ICD-10-CM | POA: Diagnosis not present

## 2019-05-14 DIAGNOSIS — R531 Weakness: Secondary | ICD-10-CM | POA: Diagnosis not present

## 2019-05-14 DIAGNOSIS — Z7984 Long term (current) use of oral hypoglycemic drugs: Secondary | ICD-10-CM | POA: Diagnosis not present

## 2019-05-14 DIAGNOSIS — I77819 Aortic ectasia, unspecified site: Secondary | ICD-10-CM | POA: Diagnosis not present

## 2019-05-14 DIAGNOSIS — R0602 Shortness of breath: Secondary | ICD-10-CM | POA: Diagnosis not present

## 2019-05-14 DIAGNOSIS — E781 Pure hyperglyceridemia: Secondary | ICD-10-CM | POA: Insufficient documentation

## 2019-05-14 DIAGNOSIS — E786 Lipoprotein deficiency: Secondary | ICD-10-CM | POA: Insufficient documentation

## 2019-05-14 DIAGNOSIS — I4510 Unspecified right bundle-branch block: Secondary | ICD-10-CM | POA: Diagnosis not present

## 2019-05-14 DIAGNOSIS — R809 Proteinuria, unspecified: Secondary | ICD-10-CM | POA: Diagnosis not present

## 2019-05-14 DIAGNOSIS — E119 Type 2 diabetes mellitus without complications: Secondary | ICD-10-CM | POA: Diagnosis not present

## 2019-05-23 DIAGNOSIS — Q246 Congenital heart block: Secondary | ICD-10-CM | POA: Diagnosis not present

## 2019-05-23 DIAGNOSIS — Z45018 Encounter for adjustment and management of other part of cardiac pacemaker: Secondary | ICD-10-CM | POA: Diagnosis not present

## 2019-06-05 DIAGNOSIS — I10 Essential (primary) hypertension: Secondary | ICD-10-CM | POA: Diagnosis not present

## 2019-06-05 DIAGNOSIS — E7849 Other hyperlipidemia: Secondary | ICD-10-CM | POA: Diagnosis not present

## 2019-06-14 DIAGNOSIS — H9209 Otalgia, unspecified ear: Secondary | ICD-10-CM | POA: Diagnosis not present

## 2019-06-14 DIAGNOSIS — I442 Atrioventricular block, complete: Secondary | ICD-10-CM | POA: Diagnosis not present

## 2019-06-14 DIAGNOSIS — N183 Chronic kidney disease, stage 3 unspecified: Secondary | ICD-10-CM | POA: Diagnosis not present

## 2019-06-14 DIAGNOSIS — E1122 Type 2 diabetes mellitus with diabetic chronic kidney disease: Secondary | ICD-10-CM | POA: Diagnosis not present

## 2019-06-14 DIAGNOSIS — I10 Essential (primary) hypertension: Secondary | ICD-10-CM | POA: Diagnosis not present

## 2019-06-19 DIAGNOSIS — L609 Nail disorder, unspecified: Secondary | ICD-10-CM | POA: Diagnosis not present

## 2019-06-19 DIAGNOSIS — E1140 Type 2 diabetes mellitus with diabetic neuropathy, unspecified: Secondary | ICD-10-CM | POA: Diagnosis not present

## 2019-06-19 DIAGNOSIS — L110 Acquired keratosis follicularis: Secondary | ICD-10-CM | POA: Diagnosis not present

## 2019-06-27 DIAGNOSIS — Z45018 Encounter for adjustment and management of other part of cardiac pacemaker: Secondary | ICD-10-CM | POA: Diagnosis not present

## 2019-07-18 DIAGNOSIS — R0602 Shortness of breath: Secondary | ICD-10-CM | POA: Diagnosis not present

## 2019-07-18 DIAGNOSIS — E119 Type 2 diabetes mellitus without complications: Secondary | ICD-10-CM | POA: Diagnosis not present

## 2019-07-18 DIAGNOSIS — I442 Atrioventricular block, complete: Secondary | ICD-10-CM | POA: Diagnosis not present

## 2019-07-18 DIAGNOSIS — Z79899 Other long term (current) drug therapy: Secondary | ICD-10-CM | POA: Diagnosis not present

## 2019-07-18 DIAGNOSIS — Z833 Family history of diabetes mellitus: Secondary | ICD-10-CM | POA: Diagnosis not present

## 2019-07-18 DIAGNOSIS — R5383 Other fatigue: Secondary | ICD-10-CM | POA: Diagnosis not present

## 2019-07-18 DIAGNOSIS — Z7984 Long term (current) use of oral hypoglycemic drugs: Secondary | ICD-10-CM | POA: Diagnosis not present

## 2019-07-18 DIAGNOSIS — Z950 Presence of cardiac pacemaker: Secondary | ICD-10-CM | POA: Diagnosis not present

## 2019-07-18 DIAGNOSIS — Z45018 Encounter for adjustment and management of other part of cardiac pacemaker: Secondary | ICD-10-CM | POA: Diagnosis not present

## 2019-07-18 DIAGNOSIS — Z95810 Presence of automatic (implantable) cardiac defibrillator: Secondary | ICD-10-CM | POA: Diagnosis not present

## 2019-08-05 DIAGNOSIS — I442 Atrioventricular block, complete: Secondary | ICD-10-CM | POA: Diagnosis not present

## 2019-08-05 DIAGNOSIS — N183 Chronic kidney disease, stage 3 unspecified: Secondary | ICD-10-CM | POA: Diagnosis not present

## 2019-08-05 DIAGNOSIS — I129 Hypertensive chronic kidney disease with stage 1 through stage 4 chronic kidney disease, or unspecified chronic kidney disease: Secondary | ICD-10-CM | POA: Diagnosis not present

## 2019-08-05 DIAGNOSIS — E1122 Type 2 diabetes mellitus with diabetic chronic kidney disease: Secondary | ICD-10-CM | POA: Diagnosis not present

## 2019-09-04 DIAGNOSIS — E1122 Type 2 diabetes mellitus with diabetic chronic kidney disease: Secondary | ICD-10-CM | POA: Diagnosis not present

## 2019-09-04 DIAGNOSIS — I129 Hypertensive chronic kidney disease with stage 1 through stage 4 chronic kidney disease, or unspecified chronic kidney disease: Secondary | ICD-10-CM | POA: Diagnosis not present

## 2019-09-04 DIAGNOSIS — N183 Chronic kidney disease, stage 3 unspecified: Secondary | ICD-10-CM | POA: Diagnosis not present

## 2019-09-04 DIAGNOSIS — I442 Atrioventricular block, complete: Secondary | ICD-10-CM | POA: Diagnosis not present

## 2019-09-26 DIAGNOSIS — Z45018 Encounter for adjustment and management of other part of cardiac pacemaker: Secondary | ICD-10-CM | POA: Diagnosis not present

## 2019-11-05 DIAGNOSIS — I442 Atrioventricular block, complete: Secondary | ICD-10-CM | POA: Diagnosis not present

## 2019-11-05 DIAGNOSIS — N183 Chronic kidney disease, stage 3 unspecified: Secondary | ICD-10-CM | POA: Diagnosis not present

## 2019-11-05 DIAGNOSIS — E1122 Type 2 diabetes mellitus with diabetic chronic kidney disease: Secondary | ICD-10-CM | POA: Diagnosis not present

## 2019-11-05 DIAGNOSIS — I129 Hypertensive chronic kidney disease with stage 1 through stage 4 chronic kidney disease, or unspecified chronic kidney disease: Secondary | ICD-10-CM | POA: Diagnosis not present

## 2019-11-06 DIAGNOSIS — L110 Acquired keratosis follicularis: Secondary | ICD-10-CM | POA: Diagnosis not present

## 2019-11-06 DIAGNOSIS — E1140 Type 2 diabetes mellitus with diabetic neuropathy, unspecified: Secondary | ICD-10-CM | POA: Diagnosis not present

## 2019-11-06 DIAGNOSIS — L03032 Cellulitis of left toe: Secondary | ICD-10-CM | POA: Diagnosis not present

## 2019-11-06 DIAGNOSIS — L609 Nail disorder, unspecified: Secondary | ICD-10-CM | POA: Diagnosis not present

## 2019-11-13 DIAGNOSIS — M79672 Pain in left foot: Secondary | ICD-10-CM | POA: Diagnosis not present

## 2019-11-13 DIAGNOSIS — L03032 Cellulitis of left toe: Secondary | ICD-10-CM | POA: Diagnosis not present

## 2019-11-13 DIAGNOSIS — L89893 Pressure ulcer of other site, stage 3: Secondary | ICD-10-CM | POA: Diagnosis not present

## 2019-11-13 DIAGNOSIS — M79675 Pain in left toe(s): Secondary | ICD-10-CM | POA: Diagnosis not present

## 2019-12-04 DIAGNOSIS — L89893 Pressure ulcer of other site, stage 3: Secondary | ICD-10-CM | POA: Diagnosis not present

## 2019-12-04 DIAGNOSIS — L03032 Cellulitis of left toe: Secondary | ICD-10-CM | POA: Diagnosis not present

## 2019-12-04 DIAGNOSIS — M79675 Pain in left toe(s): Secondary | ICD-10-CM | POA: Diagnosis not present

## 2019-12-04 DIAGNOSIS — M79672 Pain in left foot: Secondary | ICD-10-CM | POA: Diagnosis not present

## 2019-12-04 DIAGNOSIS — E1140 Type 2 diabetes mellitus with diabetic neuropathy, unspecified: Secondary | ICD-10-CM | POA: Diagnosis not present

## 2019-12-05 DIAGNOSIS — N183 Chronic kidney disease, stage 3 unspecified: Secondary | ICD-10-CM | POA: Diagnosis not present

## 2019-12-05 DIAGNOSIS — I129 Hypertensive chronic kidney disease with stage 1 through stage 4 chronic kidney disease, or unspecified chronic kidney disease: Secondary | ICD-10-CM | POA: Diagnosis not present

## 2019-12-05 DIAGNOSIS — I442 Atrioventricular block, complete: Secondary | ICD-10-CM | POA: Diagnosis not present

## 2019-12-05 DIAGNOSIS — E1122 Type 2 diabetes mellitus with diabetic chronic kidney disease: Secondary | ICD-10-CM | POA: Diagnosis not present

## 2019-12-11 DIAGNOSIS — E1140 Type 2 diabetes mellitus with diabetic neuropathy, unspecified: Secondary | ICD-10-CM | POA: Diagnosis not present

## 2019-12-11 DIAGNOSIS — L89893 Pressure ulcer of other site, stage 3: Secondary | ICD-10-CM | POA: Diagnosis not present

## 2019-12-11 DIAGNOSIS — L03032 Cellulitis of left toe: Secondary | ICD-10-CM | POA: Diagnosis not present

## 2019-12-11 DIAGNOSIS — M79675 Pain in left toe(s): Secondary | ICD-10-CM | POA: Diagnosis not present

## 2019-12-11 DIAGNOSIS — M79672 Pain in left foot: Secondary | ICD-10-CM | POA: Diagnosis not present

## 2019-12-26 DIAGNOSIS — Z45018 Encounter for adjustment and management of other part of cardiac pacemaker: Secondary | ICD-10-CM | POA: Diagnosis not present

## 2020-01-01 DIAGNOSIS — M79675 Pain in left toe(s): Secondary | ICD-10-CM | POA: Diagnosis not present

## 2020-01-01 DIAGNOSIS — L89893 Pressure ulcer of other site, stage 3: Secondary | ICD-10-CM | POA: Diagnosis not present

## 2020-01-01 DIAGNOSIS — L03032 Cellulitis of left toe: Secondary | ICD-10-CM | POA: Diagnosis not present

## 2020-01-01 DIAGNOSIS — M79672 Pain in left foot: Secondary | ICD-10-CM | POA: Diagnosis not present

## 2020-01-01 DIAGNOSIS — E1140 Type 2 diabetes mellitus with diabetic neuropathy, unspecified: Secondary | ICD-10-CM | POA: Diagnosis not present

## 2020-01-04 DIAGNOSIS — I442 Atrioventricular block, complete: Secondary | ICD-10-CM | POA: Diagnosis not present

## 2020-01-04 DIAGNOSIS — E1122 Type 2 diabetes mellitus with diabetic chronic kidney disease: Secondary | ICD-10-CM | POA: Diagnosis not present

## 2020-01-04 DIAGNOSIS — N183 Chronic kidney disease, stage 3 unspecified: Secondary | ICD-10-CM | POA: Diagnosis not present

## 2020-01-04 DIAGNOSIS — I129 Hypertensive chronic kidney disease with stage 1 through stage 4 chronic kidney disease, or unspecified chronic kidney disease: Secondary | ICD-10-CM | POA: Diagnosis not present

## 2020-01-10 DIAGNOSIS — R739 Hyperglycemia, unspecified: Secondary | ICD-10-CM | POA: Diagnosis not present

## 2020-01-10 DIAGNOSIS — Z13220 Encounter for screening for lipoid disorders: Secondary | ICD-10-CM | POA: Diagnosis not present

## 2020-01-10 DIAGNOSIS — E1122 Type 2 diabetes mellitus with diabetic chronic kidney disease: Secondary | ICD-10-CM | POA: Diagnosis not present

## 2020-01-10 DIAGNOSIS — N183 Chronic kidney disease, stage 3 unspecified: Secondary | ICD-10-CM | POA: Diagnosis not present

## 2020-01-10 DIAGNOSIS — I10 Essential (primary) hypertension: Secondary | ICD-10-CM | POA: Diagnosis not present

## 2020-01-17 DIAGNOSIS — D519 Vitamin B12 deficiency anemia, unspecified: Secondary | ICD-10-CM | POA: Diagnosis not present

## 2020-01-17 DIAGNOSIS — Z23 Encounter for immunization: Secondary | ICD-10-CM | POA: Diagnosis not present

## 2020-01-17 DIAGNOSIS — I10 Essential (primary) hypertension: Secondary | ICD-10-CM | POA: Diagnosis not present

## 2020-01-17 DIAGNOSIS — E1165 Type 2 diabetes mellitus with hyperglycemia: Secondary | ICD-10-CM | POA: Diagnosis not present

## 2020-01-17 DIAGNOSIS — Z1389 Encounter for screening for other disorder: Secondary | ICD-10-CM | POA: Diagnosis not present

## 2020-01-17 DIAGNOSIS — Z0000 Encounter for general adult medical examination without abnormal findings: Secondary | ICD-10-CM | POA: Diagnosis not present

## 2020-01-17 DIAGNOSIS — E1122 Type 2 diabetes mellitus with diabetic chronic kidney disease: Secondary | ICD-10-CM | POA: Diagnosis not present

## 2020-01-22 DIAGNOSIS — L03032 Cellulitis of left toe: Secondary | ICD-10-CM | POA: Diagnosis not present

## 2020-01-22 DIAGNOSIS — M79672 Pain in left foot: Secondary | ICD-10-CM | POA: Diagnosis not present

## 2020-01-22 DIAGNOSIS — E1140 Type 2 diabetes mellitus with diabetic neuropathy, unspecified: Secondary | ICD-10-CM | POA: Diagnosis not present

## 2020-01-22 DIAGNOSIS — L89893 Pressure ulcer of other site, stage 3: Secondary | ICD-10-CM | POA: Diagnosis not present

## 2020-01-22 DIAGNOSIS — M79675 Pain in left toe(s): Secondary | ICD-10-CM | POA: Diagnosis not present

## 2020-01-28 DIAGNOSIS — C44622 Squamous cell carcinoma of skin of right upper limb, including shoulder: Secondary | ICD-10-CM | POA: Diagnosis not present

## 2020-01-28 DIAGNOSIS — C4442 Squamous cell carcinoma of skin of scalp and neck: Secondary | ICD-10-CM | POA: Diagnosis not present

## 2020-02-04 DIAGNOSIS — I129 Hypertensive chronic kidney disease with stage 1 through stage 4 chronic kidney disease, or unspecified chronic kidney disease: Secondary | ICD-10-CM | POA: Diagnosis not present

## 2020-02-04 DIAGNOSIS — N183 Chronic kidney disease, stage 3 unspecified: Secondary | ICD-10-CM | POA: Diagnosis not present

## 2020-02-04 DIAGNOSIS — E1122 Type 2 diabetes mellitus with diabetic chronic kidney disease: Secondary | ICD-10-CM | POA: Diagnosis not present

## 2020-02-05 DIAGNOSIS — E1140 Type 2 diabetes mellitus with diabetic neuropathy, unspecified: Secondary | ICD-10-CM | POA: Diagnosis not present

## 2020-02-05 DIAGNOSIS — L89893 Pressure ulcer of other site, stage 3: Secondary | ICD-10-CM | POA: Diagnosis not present

## 2020-02-05 DIAGNOSIS — L03032 Cellulitis of left toe: Secondary | ICD-10-CM | POA: Diagnosis not present

## 2020-02-05 DIAGNOSIS — M79672 Pain in left foot: Secondary | ICD-10-CM | POA: Diagnosis not present

## 2020-02-05 DIAGNOSIS — M79675 Pain in left toe(s): Secondary | ICD-10-CM | POA: Diagnosis not present

## 2020-02-11 DIAGNOSIS — Z08 Encounter for follow-up examination after completed treatment for malignant neoplasm: Secondary | ICD-10-CM | POA: Diagnosis not present

## 2020-02-12 DIAGNOSIS — I743 Embolism and thrombosis of arteries of the lower extremities: Secondary | ICD-10-CM | POA: Diagnosis not present

## 2020-02-12 DIAGNOSIS — I739 Peripheral vascular disease, unspecified: Secondary | ICD-10-CM | POA: Diagnosis not present

## 2020-02-12 DIAGNOSIS — R9389 Abnormal findings on diagnostic imaging of other specified body structures: Secondary | ICD-10-CM | POA: Diagnosis not present

## 2020-02-12 DIAGNOSIS — L89893 Pressure ulcer of other site, stage 3: Secondary | ICD-10-CM | POA: Diagnosis not present

## 2020-02-19 DIAGNOSIS — E1140 Type 2 diabetes mellitus with diabetic neuropathy, unspecified: Secondary | ICD-10-CM | POA: Diagnosis not present

## 2020-02-19 DIAGNOSIS — L89893 Pressure ulcer of other site, stage 3: Secondary | ICD-10-CM | POA: Diagnosis not present

## 2020-02-19 DIAGNOSIS — L03032 Cellulitis of left toe: Secondary | ICD-10-CM | POA: Diagnosis not present

## 2020-02-19 DIAGNOSIS — M79672 Pain in left foot: Secondary | ICD-10-CM | POA: Diagnosis not present

## 2020-02-19 DIAGNOSIS — M79675 Pain in left toe(s): Secondary | ICD-10-CM | POA: Diagnosis not present

## 2020-03-06 DIAGNOSIS — E1122 Type 2 diabetes mellitus with diabetic chronic kidney disease: Secondary | ICD-10-CM | POA: Diagnosis not present

## 2020-03-06 DIAGNOSIS — N183 Chronic kidney disease, stage 3 unspecified: Secondary | ICD-10-CM | POA: Diagnosis not present

## 2020-03-06 DIAGNOSIS — I129 Hypertensive chronic kidney disease with stage 1 through stage 4 chronic kidney disease, or unspecified chronic kidney disease: Secondary | ICD-10-CM | POA: Diagnosis not present

## 2020-03-18 DIAGNOSIS — E1140 Type 2 diabetes mellitus with diabetic neuropathy, unspecified: Secondary | ICD-10-CM | POA: Diagnosis not present

## 2020-03-18 DIAGNOSIS — M79675 Pain in left toe(s): Secondary | ICD-10-CM | POA: Diagnosis not present

## 2020-03-18 DIAGNOSIS — L03032 Cellulitis of left toe: Secondary | ICD-10-CM | POA: Diagnosis not present

## 2020-03-18 DIAGNOSIS — L89893 Pressure ulcer of other site, stage 3: Secondary | ICD-10-CM | POA: Diagnosis not present

## 2020-03-18 DIAGNOSIS — M79672 Pain in left foot: Secondary | ICD-10-CM | POA: Diagnosis not present

## 2020-03-26 DIAGNOSIS — Z45018 Encounter for adjustment and management of other part of cardiac pacemaker: Secondary | ICD-10-CM | POA: Diagnosis not present

## 2020-04-04 DIAGNOSIS — I442 Atrioventricular block, complete: Secondary | ICD-10-CM | POA: Diagnosis not present

## 2020-04-04 DIAGNOSIS — N183 Chronic kidney disease, stage 3 unspecified: Secondary | ICD-10-CM | POA: Diagnosis not present

## 2020-04-04 DIAGNOSIS — I129 Hypertensive chronic kidney disease with stage 1 through stage 4 chronic kidney disease, or unspecified chronic kidney disease: Secondary | ICD-10-CM | POA: Diagnosis not present

## 2020-04-04 DIAGNOSIS — E1122 Type 2 diabetes mellitus with diabetic chronic kidney disease: Secondary | ICD-10-CM | POA: Diagnosis not present

## 2020-04-15 DIAGNOSIS — M79675 Pain in left toe(s): Secondary | ICD-10-CM | POA: Diagnosis not present

## 2020-04-15 DIAGNOSIS — L03032 Cellulitis of left toe: Secondary | ICD-10-CM | POA: Diagnosis not present

## 2020-04-15 DIAGNOSIS — E1140 Type 2 diabetes mellitus with diabetic neuropathy, unspecified: Secondary | ICD-10-CM | POA: Diagnosis not present

## 2020-04-15 DIAGNOSIS — L89893 Pressure ulcer of other site, stage 3: Secondary | ICD-10-CM | POA: Diagnosis not present

## 2020-04-15 DIAGNOSIS — M79672 Pain in left foot: Secondary | ICD-10-CM | POA: Diagnosis not present

## 2020-05-04 DIAGNOSIS — I442 Atrioventricular block, complete: Secondary | ICD-10-CM | POA: Diagnosis not present

## 2020-05-04 DIAGNOSIS — N183 Chronic kidney disease, stage 3 unspecified: Secondary | ICD-10-CM | POA: Diagnosis not present

## 2020-05-04 DIAGNOSIS — I129 Hypertensive chronic kidney disease with stage 1 through stage 4 chronic kidney disease, or unspecified chronic kidney disease: Secondary | ICD-10-CM | POA: Diagnosis not present

## 2020-05-04 DIAGNOSIS — E1122 Type 2 diabetes mellitus with diabetic chronic kidney disease: Secondary | ICD-10-CM | POA: Diagnosis not present

## 2020-05-13 DIAGNOSIS — L89893 Pressure ulcer of other site, stage 3: Secondary | ICD-10-CM | POA: Diagnosis not present

## 2020-05-13 DIAGNOSIS — E1140 Type 2 diabetes mellitus with diabetic neuropathy, unspecified: Secondary | ICD-10-CM | POA: Diagnosis not present

## 2020-05-13 DIAGNOSIS — L03032 Cellulitis of left toe: Secondary | ICD-10-CM | POA: Diagnosis not present

## 2020-05-13 DIAGNOSIS — M79672 Pain in left foot: Secondary | ICD-10-CM | POA: Diagnosis not present

## 2020-05-13 DIAGNOSIS — M79675 Pain in left toe(s): Secondary | ICD-10-CM | POA: Diagnosis not present

## 2020-06-03 DIAGNOSIS — N183 Chronic kidney disease, stage 3 unspecified: Secondary | ICD-10-CM | POA: Diagnosis not present

## 2020-06-03 DIAGNOSIS — E1122 Type 2 diabetes mellitus with diabetic chronic kidney disease: Secondary | ICD-10-CM | POA: Diagnosis not present

## 2020-06-03 DIAGNOSIS — I442 Atrioventricular block, complete: Secondary | ICD-10-CM | POA: Diagnosis not present

## 2020-06-03 DIAGNOSIS — I129 Hypertensive chronic kidney disease with stage 1 through stage 4 chronic kidney disease, or unspecified chronic kidney disease: Secondary | ICD-10-CM | POA: Diagnosis not present

## 2020-06-25 DIAGNOSIS — Z45018 Encounter for adjustment and management of other part of cardiac pacemaker: Secondary | ICD-10-CM | POA: Diagnosis not present

## 2020-08-03 DIAGNOSIS — E1122 Type 2 diabetes mellitus with diabetic chronic kidney disease: Secondary | ICD-10-CM | POA: Diagnosis not present

## 2020-08-03 DIAGNOSIS — I129 Hypertensive chronic kidney disease with stage 1 through stage 4 chronic kidney disease, or unspecified chronic kidney disease: Secondary | ICD-10-CM | POA: Diagnosis not present

## 2020-08-03 DIAGNOSIS — N183 Chronic kidney disease, stage 3 unspecified: Secondary | ICD-10-CM | POA: Diagnosis not present

## 2020-08-03 DIAGNOSIS — I442 Atrioventricular block, complete: Secondary | ICD-10-CM | POA: Diagnosis not present

## 2020-08-25 DIAGNOSIS — Z08 Encounter for follow-up examination after completed treatment for malignant neoplasm: Secondary | ICD-10-CM | POA: Diagnosis not present

## 2020-08-25 DIAGNOSIS — Z85828 Personal history of other malignant neoplasm of skin: Secondary | ICD-10-CM | POA: Diagnosis not present

## 2020-08-25 DIAGNOSIS — C44622 Squamous cell carcinoma of skin of right upper limb, including shoulder: Secondary | ICD-10-CM | POA: Diagnosis not present

## 2020-08-27 DIAGNOSIS — R29898 Other symptoms and signs involving the musculoskeletal system: Secondary | ICD-10-CM | POA: Diagnosis not present

## 2020-08-27 DIAGNOSIS — M549 Dorsalgia, unspecified: Secondary | ICD-10-CM | POA: Diagnosis not present

## 2020-08-27 DIAGNOSIS — Z923 Personal history of irradiation: Secondary | ICD-10-CM | POA: Diagnosis not present

## 2020-09-03 DIAGNOSIS — I442 Atrioventricular block, complete: Secondary | ICD-10-CM | POA: Diagnosis not present

## 2020-09-03 DIAGNOSIS — I129 Hypertensive chronic kidney disease with stage 1 through stage 4 chronic kidney disease, or unspecified chronic kidney disease: Secondary | ICD-10-CM | POA: Diagnosis not present

## 2020-09-03 DIAGNOSIS — N183 Chronic kidney disease, stage 3 unspecified: Secondary | ICD-10-CM | POA: Diagnosis not present

## 2020-09-03 DIAGNOSIS — E1122 Type 2 diabetes mellitus with diabetic chronic kidney disease: Secondary | ICD-10-CM | POA: Diagnosis not present

## 2020-09-24 DIAGNOSIS — Z45018 Encounter for adjustment and management of other part of cardiac pacemaker: Secondary | ICD-10-CM | POA: Diagnosis not present

## 2020-10-04 DIAGNOSIS — N183 Chronic kidney disease, stage 3 unspecified: Secondary | ICD-10-CM | POA: Diagnosis not present

## 2020-10-04 DIAGNOSIS — I442 Atrioventricular block, complete: Secondary | ICD-10-CM | POA: Diagnosis not present

## 2020-10-04 DIAGNOSIS — E1122 Type 2 diabetes mellitus with diabetic chronic kidney disease: Secondary | ICD-10-CM | POA: Diagnosis not present

## 2020-10-04 DIAGNOSIS — I129 Hypertensive chronic kidney disease with stage 1 through stage 4 chronic kidney disease, or unspecified chronic kidney disease: Secondary | ICD-10-CM | POA: Diagnosis not present

## 2020-11-11 ENCOUNTER — Ambulatory Visit: Payer: Medicare Other | Admitting: Urology

## 2020-12-21 DIAGNOSIS — H10013 Acute follicular conjunctivitis, bilateral: Secondary | ICD-10-CM | POA: Diagnosis not present

## 2020-12-24 DIAGNOSIS — Z45018 Encounter for adjustment and management of other part of cardiac pacemaker: Secondary | ICD-10-CM | POA: Diagnosis not present

## 2020-12-30 ENCOUNTER — Ambulatory Visit: Payer: Medicare Other | Admitting: Urology

## 2020-12-30 ENCOUNTER — Other Ambulatory Visit: Payer: Self-pay

## 2020-12-30 ENCOUNTER — Encounter: Payer: Self-pay | Admitting: Urology

## 2020-12-30 VITALS — BP 146/76 | HR 67 | Temp 97.5°F

## 2020-12-30 DIAGNOSIS — C61 Malignant neoplasm of prostate: Secondary | ICD-10-CM

## 2020-12-30 DIAGNOSIS — R972 Elevated prostate specific antigen [PSA]: Secondary | ICD-10-CM | POA: Diagnosis not present

## 2020-12-30 LAB — URINALYSIS, ROUTINE W REFLEX MICROSCOPIC
Bilirubin, UA: NEGATIVE
Ketones, UA: NEGATIVE
Leukocytes,UA: NEGATIVE
Nitrite, UA: NEGATIVE
Protein,UA: NEGATIVE
RBC, UA: NEGATIVE
Specific Gravity, UA: 1.015 (ref 1.005–1.030)
Urobilinogen, Ur: 0.2 mg/dL (ref 0.2–1.0)
pH, UA: 5.5 (ref 5.0–7.5)

## 2020-12-30 NOTE — Progress Notes (Signed)

## 2020-12-30 NOTE — Patient Instructions (Signed)
Prostate Cancer °The prostate is a small gland that helps make semen. It is located below a man's bladder, in front of the rectum. Prostate cancer is when abnormal cells grow in this gland. °What are the causes? °The cause of this condition is not known. °What increases the risk? °Being age 80 or older. °Having a family history of prostate cancer. °Having a family history of cancer of the breasts or ovaries. °Having genes that are passed from parent to child (inherited). °Having Lynch syndrome. °African American men and men of African descent are diagnosed with prostate cancer at higher rates than other men. °What are the signs or symptoms? °Problems peeing (urinating). This may include: °A stream that is weak, or pee that stops and starts. °Trouble starting or stopping your pee. °Trouble emptying all of your pee. °Needing to pee more often, especially at night. °Blood in your pee or semen. °Pain in the: °Lower back. °Lower belly (abdomen). °Hips. °Trouble getting an erection. °Weakness or numbness in the legs or feet. °How is this treated? °Treatment for this condition depends on: °How much the cancer has spread. °Your age. °The kind of treatment you want. °Your health. °Treatments include: °Being watched. This is called observation. You will be tested from time to time, but you will not get treated. Tests are to make sure that the cancer is not growing. °Surgery. This may be done to: °Take out (remove) the prostate. °Freeze and kill cancer cells. °Radiation. This uses a strong beam of energy to kill cancer cells. °Chemotherapy. This uses medicines that stop cancer cells from increasing. This kills cancer cells and healthy cells. °Targeted therapy. This kills cancer cells only. Healthy cells are not affected. °Hormone treatment. This stops the body from making hormones that help the cancer cells grow. °Follow these instructions at home: °Lifestyle °Do not smoke or use any products that contain nicotine or tobacco.  If you need help quitting, ask your doctor. °Eat a healthy diet. °Treatment may affect your ability to have sex. If you have a partner, touch, hold, hug, and caress your partner to have intimate moments. °Get plenty of sleep. °Ask your doctor for help to find a support group for men with prostate cancer. °General instructions °Take over-the-counter and prescription medicines only as told by your doctor. °If you have to go to the hospital, let your cancer doctor (oncologist) know. °Keep all follow-up visits. °Where to find more information °American Cancer Society: www.cancer.org °American Society of Clinical Oncology: www.cancer.net °National Cancer Institute: www.cancer.gov °Contact a doctor if: °You have new or more trouble peeing. °You have new or more blood in your pee. °You have new or more pain in your hips, back, or chest. °Get help right away if: °You have weakness in your legs. °You lose feeling in your legs. °You cannot control your pee or your poop (stool). °You have chills or a fever. °Summary °The prostate is a male gland that helps make semen. °Prostate cancer is when abnormal cells grow in this gland. °Treatment includes doing surgery, using medicines, using strong beams of energy, or watching without treatment. °Ask your doctor for help to find a support group for men with prostate cancer. °Contact a doctor if you have problems peeing or have any new pain that you did not have before. °This information is not intended to replace advice given to you by your health care provider. Make sure you discuss any questions you have with your health care provider. °Document Revised: 05/20/2020 Document Reviewed: 05/20/2020 °Elsevier   Patient Education © 2022 Elsevier Inc. ° °

## 2020-12-30 NOTE — Progress Notes (Signed)
12/30/2020 2:55 PM   Paul Clay 22-Jun-1940 [id number]  Referring provider: Curlene Labrum, MD [place],  [place] [address]  Prostate cancer   HPI: Paul Clay is a 79yo here for evaluation of prostate cancer. He was diagnosed with prostate cancer 5 years ago with Dr. Exie Clay and was treated with IMRT. His PSA nadir was less than 1 but it has been increaseing over the past 2 years. PSA 1.7 in Nov 2021 and then 2.89 in 08/2020. He has mild LUTS on flomax 0.4mg  daily IPSS 3 QOL 2.  No bone pain.    PMH: No past medical history on file.  Surgical History:   Home Medications:  Allergies as of 12/30/2020   No Known Allergies      Medication List        Accurate as of December 30, 2020  2:55 PM. If you have any questions, ask your nurse or doctor.          cromolyn 4 % ophthalmic solution Commonly known as: OPTICROM 1 drop 2 (two) times daily.   empagliflozin 25 MG Tabs tablet Commonly known as: JARDIANCE Take 25 mg by mouth daily.   glipiZIDE 10 MG 24 hr tablet Commonly known as: GLUCOTROL XL Take 10 mg by mouth daily with breakfast.   metFORMIN 500 MG 24 hr tablet Commonly known as: GLUCOPHAGE-XR Take 2,000 mg by mouth daily with breakfast.   tamsulosin 0.4 MG Caps capsule Commonly known as: FLOMAX Take 0.4 mg by mouth daily.   VITAMIN B-COMPLEX PO Take 1 tablet by mouth daily.        Allergies: No Known Allergies  Family History: No family history on file.  Social History:  has no history on file for tobacco use, alcohol use, and drug use.  ROS: All other review of systems were reviewed and are negative except what is noted above in HPI  Physical Exam: BP (!) 146/76   Pulse 67   Temp (!) 97.5 F (36.4 C)   Constitutional:  Alert and oriented, No acute distress. HEENT: [place] AT, moist mucus membranes.  Trachea midline, no masses. Cardiovascular: No clubbing, cyanosis, or edema. Respiratory: Normal respiratory effort, no increased  work of breathing. GI: Abdomen is soft, nontender, nondistended, no abdominal masses GU: No CVA tenderness.  Lymph: No cervical or inguinal lymphadenopathy. Skin: No rashes, bruises or suspicious lesions. Neurologic: Grossly intact, no focal deficits, moving all 4 extremities. Psychiatric: Normal mood and affect.  Laboratory Data: No results found for: WBC, HGB, HCT, MCV, PLT  No results found for: CREATININE  No results found for: PSA  No results found for: TESTOSTERONE  No results found for: HGBA1C  Urinalysis No results found for: COLORURINE, APPEARANCEUR, LABSPEC, PHURINE, GLUCOSEU, HGBUR, BILIRUBINUR, KETONESUR, PROTEINUR, UROBILINOGEN, NITRITE, LEUKOCYTESUR  No results found for: LABMICR, [place], RBCUA, LABEPIT, MUCUS, BACTERIA  Pertinent Imaging: none No results found for this or any previous visit.  No results found for this or any previous visit.  No results found for this or any previous visit.  No results found for this or any previous visit.  No results found for this or any previous visit.  No results found for this or any previous visit.  No results found for this or any previous visit.  No results found for this or any previous visit.   Assessment & Plan:    1.  Prostate cancer [facility]) We discussed the natural hx of PSA recurrence after radiation therapy and the workup  including CT abd/pelvis and bone scan. We will proceed with metastatic survey prior to initiating therapy.   No follow-ups on file.  Nicolette Bang, MD  [facility] Urology [place]

## 2020-12-31 ENCOUNTER — Other Ambulatory Visit: Payer: Self-pay

## 2020-12-31 LAB — BASIC METABOLIC PANEL
BUN/Creatinine Ratio: 14 (ref 10–24)
BUN: 23 mg/dL (ref 8–27)
CO2: 19 mmol/L — ABNORMAL LOW (ref 20–29)
Calcium: 9.1 mg/dL (ref 8.6–10.2)
Chloride: 101 mmol/L (ref 96–106)
Creatinine, Ser: 1.61 mg/dL — ABNORMAL HIGH (ref 0.76–1.27)
Glucose: 255 mg/dL — ABNORMAL HIGH (ref 70–99)
Potassium: 4.6 mmol/L (ref 3.5–5.2)
Sodium: 135 mmol/L (ref 134–144)
eGFR: 43 mL/min/{1.73_m2} — ABNORMAL LOW (ref >59)

## 2021-01-13 DIAGNOSIS — Z45018 Encounter for adjustment and management of other part of cardiac pacemaker: Secondary | ICD-10-CM | POA: Diagnosis not present

## 2021-01-13 DIAGNOSIS — Z7984 Long term (current) use of oral hypoglycemic drugs: Secondary | ICD-10-CM | POA: Diagnosis not present

## 2021-01-13 DIAGNOSIS — Z79899 Other long term (current) drug therapy: Secondary | ICD-10-CM | POA: Diagnosis not present

## 2021-01-13 DIAGNOSIS — I442 Atrioventricular block, complete: Secondary | ICD-10-CM | POA: Diagnosis not present

## 2021-01-13 DIAGNOSIS — I10 Essential (primary) hypertension: Secondary | ICD-10-CM | POA: Diagnosis not present

## 2021-01-13 DIAGNOSIS — Z87891 Personal history of nicotine dependence: Secondary | ICD-10-CM | POA: Diagnosis not present

## 2021-01-13 DIAGNOSIS — I2510 Atherosclerotic heart disease of native coronary artery without angina pectoris: Secondary | ICD-10-CM | POA: Diagnosis not present

## 2021-01-13 DIAGNOSIS — E119 Type 2 diabetes mellitus without complications: Secondary | ICD-10-CM | POA: Diagnosis not present

## 2021-01-20 ENCOUNTER — Other Ambulatory Visit: Payer: Self-pay

## 2021-01-20 ENCOUNTER — Encounter (HOSPITAL_COMMUNITY): Payer: Self-pay

## 2021-01-20 ENCOUNTER — Encounter (HOSPITAL_COMMUNITY)
Admission: RE | Admit: 2021-01-20 | Discharge: 2021-01-20 | Disposition: A | Discharge status: Home / Self Care | Payer: Medicare Other | Source: Ambulatory Visit | Attending: Urology | Admitting: Urology

## 2021-01-20 ENCOUNTER — Ambulatory Visit (HOSPITAL_COMMUNITY)
Admission: RE | Admit: 2021-01-20 | Discharge: 2021-01-20 | Disposition: A | Discharge status: Home / Self Care | Payer: Medicare Other | Source: Ambulatory Visit | Attending: Urology | Admitting: Urology

## 2021-01-20 DIAGNOSIS — K449 Diaphragmatic hernia without obstruction or gangrene: Secondary | ICD-10-CM | POA: Diagnosis not present

## 2021-01-20 DIAGNOSIS — K429 Umbilical hernia without obstruction or gangrene: Secondary | ICD-10-CM | POA: Diagnosis not present

## 2021-01-20 DIAGNOSIS — C61 Malignant neoplasm of prostate: Secondary | ICD-10-CM | POA: Diagnosis not present

## 2021-01-20 DIAGNOSIS — C7951 Secondary malignant neoplasm of bone: Secondary | ICD-10-CM | POA: Diagnosis not present

## 2021-01-20 DIAGNOSIS — N281 Cyst of kidney, acquired: Secondary | ICD-10-CM | POA: Diagnosis not present

## 2021-01-20 HISTORY — DX: Malignant (primary) neoplasm, unspecified: C80.1

## 2021-01-20 HISTORY — DX: Type 2 diabetes mellitus without complications: E11.9

## 2021-01-20 MED ORDER — IOHEXOL 300 MG/ML  SOLN
75.0000 mL | Freq: Once | INTRAMUSCULAR | Status: AC | PRN
  Administered 2021-01-20: 75 mL via INTRAVENOUS

## 2021-01-20 MED ORDER — TECHNETIUM TC 99M MEDRONATE IV KIT
20.0000 | PACK | Freq: Once | INTRAVENOUS | Status: AC | PRN
  Administered 2021-01-20: 20 via INTRAVENOUS

## 2021-02-17 ENCOUNTER — Encounter: Payer: Self-pay | Admitting: Urology

## 2021-02-17 ENCOUNTER — Ambulatory Visit: Payer: Medicare Other | Admitting: Urology

## 2021-02-17 ENCOUNTER — Other Ambulatory Visit: Payer: Self-pay

## 2021-02-17 VITALS — BP 163/71 | HR 79

## 2021-02-17 DIAGNOSIS — C61 Malignant neoplasm of prostate: Secondary | ICD-10-CM

## 2021-02-17 DIAGNOSIS — R972 Elevated prostate specific antigen [PSA]: Secondary | ICD-10-CM

## 2021-02-17 NOTE — Patient Instructions (Signed)
Prostate Cancer °The prostate is a small gland that helps make semen. It is located below a man's bladder, in front of the rectum. Prostate cancer is when abnormal cells grow in this gland. °What are the causes? °The cause of this condition is not known. °What increases the risk? °Being age 80 or older. °Having a family history of prostate cancer. °Having a family history of cancer of the breasts or ovaries. °Having genes that are passed from parent to child (inherited). °Having Lynch syndrome. °African American men and men of African descent are diagnosed with prostate cancer at higher rates than other men. °What are the signs or symptoms? °Problems peeing (urinating). This may include: °A stream that is weak, or pee that stops and starts. °Trouble starting or stopping your pee. °Trouble emptying all of your pee. °Needing to pee more often, especially at night. °Blood in your pee or semen. °Pain in the: °Lower back. °Lower belly (abdomen). °Hips. °Trouble getting an erection. °Weakness or numbness in the legs or feet. °How is this treated? °Treatment for this condition depends on: °How much the cancer has spread. °Your age. °The kind of treatment you want. °Your health. °Treatments include: °Being watched. This is called observation. You will be tested from time to time, but you will not get treated. Tests are to make sure that the cancer is not growing. °Surgery. This may be done to: °Take out (remove) the prostate. °Freeze and kill cancer cells. °Radiation. This uses a strong beam of energy to kill cancer cells. °Chemotherapy. This uses medicines that stop cancer cells from increasing. This kills cancer cells and healthy cells. °Targeted therapy. This kills cancer cells only. Healthy cells are not affected. °Hormone treatment. This stops the body from making hormones that help the cancer cells grow. °Follow these instructions at home: °Lifestyle °Do not smoke or use any products that contain nicotine or tobacco.  If you need help quitting, ask your doctor. °Eat a healthy diet. °Treatment may affect your ability to have sex. If you have a partner, touch, hold, hug, and caress your partner to have intimate moments. °Get plenty of sleep. °Ask your doctor for help to find a support group for men with prostate cancer. °General instructions °Take over-the-counter and prescription medicines only as told by your doctor. °If you have to go to the hospital, let your cancer doctor (oncologist) know. °Keep all follow-up visits. °Where to find more information °American Cancer Society: www.cancer.org °American Society of Clinical Oncology: www.cancer.net °National Cancer Institute: www.cancer.gov °Contact a doctor if: °You have new or more trouble peeing. °You have new or more blood in your pee. °You have new or more pain in your hips, back, or chest. °Get help right away if: °You have weakness in your legs. °You lose feeling in your legs. °You cannot control your pee or your poop (stool). °You have chills or a fever. °Summary °The prostate is a male gland that helps make semen. °Prostate cancer is when abnormal cells grow in this gland. °Treatment includes doing surgery, using medicines, using strong beams of energy, or watching without treatment. °Ask your doctor for help to find a support group for men with prostate cancer. °Contact a doctor if you have problems peeing or have any new pain that you did not have before. °This information is not intended to replace advice given to you by your health care provider. Make sure you discuss any questions you have with your health care provider. °Document Revised: 05/20/2020 Document Reviewed: 05/20/2020 °Elsevier   Patient Education © 2022 Elsevier Inc. ° °

## 2021-02-17 NOTE — Progress Notes (Signed)

## 2021-02-17 NOTE — Progress Notes (Signed)
02/17/2021 10:54 AM   Paul Clay 23-Mar-1940 [id number]  Referring provider: Curlene Labrum, MD [place],  [place] [address]  Followup prostate cancer   HPI: Paul Clay is a 80yo here for followup for prostate cancer. He underwent CT and bone scan which showed a sclerotic lesion in his left 12th rib concerning for metastatic disease. No history of fracture. No other complaints today.    PMH: Past Medical History:  Diagnosis Date   Cancer ([place])    Diabetes mellitus without complication ([place])     Surgical History: No past surgical history on file.  Home Medications:  Allergies as of 02/17/2021   No Known Allergies      Medication List        Accurate as of February 17, 2021 10:54 AM. If you have any questions, ask your nurse or doctor.          cromolyn 4 % ophthalmic solution Commonly known as: OPTICROM 1 drop 2 (two) times daily.   empagliflozin 25 MG Tabs tablet Commonly known as: JARDIANCE Take 25 mg by mouth daily.   glipiZIDE 10 MG 24 hr tablet Commonly known as: GLUCOTROL XL Take 10 mg by mouth daily with breakfast.   lisinopril 5 MG tablet Commonly known as: ZESTRIL Take 5 mg by mouth daily.   metFORMIN 500 MG 24 hr tablet Commonly known as: GLUCOPHAGE-XR Take 2,000 mg by mouth daily with breakfast.   tamsulosin 0.4 MG Caps capsule Commonly known as: FLOMAX Take 0.4 mg by mouth daily.   VITAMIN B-COMPLEX PO Take 1 tablet by mouth daily.        Allergies: No Known Allergies  Family History: No family history on file.  Social History:  has no history on file for tobacco use, alcohol use, and drug use.  ROS: All other review of systems were reviewed and are negative except what is noted above in HPI  Physical Exam: BP (!) 163/71    Pulse 79   Constitutional:  Alert and oriented, No acute distress. HEENT: [place] AT, moist mucus membranes.  Trachea midline, no masses. Cardiovascular: No clubbing, cyanosis, or  edema. Respiratory: Normal respiratory effort, no increased work of breathing. GI: Abdomen is soft, nontender, nondistended, no abdominal masses GU: No CVA tenderness.  Lymph: No cervical or inguinal lymphadenopathy. Skin: No rashes, bruises or suspicious lesions. Neurologic: Grossly intact, no focal deficits, moving all 4 extremities. Psychiatric: Normal mood and affect.  Laboratory Data: No results found for: WBC, HGB, HCT, MCV, PLT  Lab Results  Component Value Date   CREATININE 1.61 (H) 12/30/2020    No results found for: PSA  No results found for: TESTOSTERONE  No results found for: HGBA1C  Urinalysis    Component Value Date/Time   APPEARANCEUR Clear 12/30/2020 1438   GLUCOSEU 3+ (A) 12/30/2020 1438   BILIRUBINUR Negative 12/30/2020 1438   PROTEINUR Negative 12/30/2020 1438   NITRITE Negative 12/30/2020 1438   LEUKOCYTESUR Negative 12/30/2020 1438    Lab Results  Component Value Date   LABMICR Comment 12/30/2020    Pertinent Imaging: CT and bone scan 01/20/2021: Images reviewed and discussed with the patient No results found for this or any previous visit.  No results found for this or any previous visit.  No results found for this or any previous visit.  No results found for this or any previous visit.  No results found for this or any previous visit.  No results found for this or any  previous visit.  No results found for this or any previous visit.  No results found for this or any previous visit.   Assessment & Plan:    1. 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  Nicolette Bang, MD  [facility] Urology [place]

## 2021-02-18 ENCOUNTER — Telehealth: Payer: Self-pay

## 2021-02-18 ENCOUNTER — Other Ambulatory Visit: Payer: Self-pay

## 2021-02-18 DIAGNOSIS — C61 Malignant neoplasm of prostate: Secondary | ICD-10-CM

## 2021-02-18 NOTE — Progress Notes (Signed)
Per Prostate Nurse Navigator for [facility] patient needs a PSMA PET scan prior to consult 12/29. Ok to place per Dr. Alyson Ingles Order placed.

## 2021-02-18 NOTE — Telephone Encounter (Signed)
RN spoke with nurse of Dr. Alyson Ingles @ [facility] - Urology.  Per recommendations RN asked if he would consider ordering a PSMA PET scan that could possibly be done prior to his consult so that we have more information and a more definitive idea of the extent of his disease to help guide treatment recommendations.    This will be reviewed with Dr. Alyson Ingles by his nurse.

## 2021-02-23 ENCOUNTER — Other Ambulatory Visit: Payer: Self-pay | Admitting: Urology

## 2021-02-23 DIAGNOSIS — C61 Malignant neoplasm of prostate: Secondary | ICD-10-CM

## 2021-02-25 NOTE — Progress Notes (Signed)
GU Location of Tumor / Histology: Prostate Ca  If Prostate Cancer, Gleason Score is  PSA is (2.89 as of 08/2020).  Biopsies:     Past/Anticipated interventions by urology, if any:   Past/Anticipated interventions by medical oncology, if any:   Weight changes, if any: No  IPSS:  5 SHIM:  5  Bowel/Bladder complaints, if any:  No bowel or bladder issues.  Nausea/Vomiting, if any:  No  Pain issues, if any:  0/10  SAFETY ISSUES: Prior radiation? Yes, 2018 Prostate Ca Pacemaker/ICD?  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 Possible current pregnancy?  Male Is the patient on methotrexate?  No  Current Complaints / other details:  More information on treatment option.

## 2021-03-02 ENCOUNTER — Ambulatory Visit (HOSPITAL_COMMUNITY)
Admission: RE | Admit: 2021-03-02 | Discharge: 2021-03-02 | Disposition: A | Discharge status: Home / Self Care | Payer: Medicare Other | Source: Ambulatory Visit | Attending: Urology | Admitting: Urology

## 2021-03-02 ENCOUNTER — Other Ambulatory Visit: Payer: Self-pay

## 2021-03-02 DIAGNOSIS — C61 Malignant neoplasm of prostate: Secondary | ICD-10-CM | POA: Diagnosis present

## 2021-03-02 DIAGNOSIS — N281 Cyst of kidney, acquired: Secondary | ICD-10-CM | POA: Diagnosis not present

## 2021-03-02 DIAGNOSIS — I700 Atherosclerosis of aorta: Secondary | ICD-10-CM | POA: Diagnosis not present

## 2021-03-02 DIAGNOSIS — J439 Emphysema, unspecified: Secondary | ICD-10-CM | POA: Diagnosis not present

## 2021-03-02 MED ORDER — PIFLIFOLASTAT F 18 (PYLARIFY) INJECTION
9.0000 | Freq: Once | INTRAVENOUS | Status: AC
  Administered 2021-03-02: 13:00:00 9.51 via INTRAVENOUS

## 2021-03-04 ENCOUNTER — Ambulatory Visit
Admission: RE | Admit: 2021-03-04 | Discharge: 2021-03-04 | Disposition: A | Discharge status: Home / Self Care | Payer: Medicare Other | Source: Ambulatory Visit | Attending: Radiation Oncology | Admitting: Radiation Oncology

## 2021-03-04 ENCOUNTER — Encounter: Payer: Self-pay | Admitting: Radiation Oncology

## 2021-03-04 ENCOUNTER — Other Ambulatory Visit: Payer: Self-pay

## 2021-03-04 VITALS — BP 134/70 | HR 80 | Temp 98.0°F | Resp 20 | Ht 70.0 in | Wt 236.0 lb

## 2021-03-04 DIAGNOSIS — Z7984 Long term (current) use of oral hypoglycemic drugs: Secondary | ICD-10-CM | POA: Insufficient documentation

## 2021-03-04 DIAGNOSIS — Z79899 Other long term (current) drug therapy: Secondary | ICD-10-CM | POA: Diagnosis not present

## 2021-03-04 DIAGNOSIS — Z923 Personal history of irradiation: Secondary | ICD-10-CM | POA: Insufficient documentation

## 2021-03-04 DIAGNOSIS — C61 Malignant neoplasm of prostate: Secondary | ICD-10-CM | POA: Diagnosis present

## 2021-03-04 DIAGNOSIS — E119 Type 2 diabetes mellitus without complications: Secondary | ICD-10-CM | POA: Insufficient documentation

## 2021-03-04 NOTE — Progress Notes (Signed)
Introduced myself to the patient, and his daughter, as the prostate nurse navigator.  No barriers to care identified at this time.  He is here to discuss his radiation treatment options.  I gave him my business card and asked him to call me with questions or concerns.  Verbalized understanding.  

## 2021-03-04 NOTE — Progress Notes (Signed)
Radiation Oncology         [phone] ________________________________  Initial Outpatient Consultation  Name: Paul Clay MRN: [id number]  Date: 03/04/2021  DOB: 01/11/1941  FY:BOFBPZ, Silvestre Moment, MD  McKenzie, Candee Furbish, MD   REFERRING PHYSICIAN: Cleon Gustin, MD  DIAGNOSIS: 80 y.o. 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    ICD-10-CM   1. Malignant neoplasm of prostate ([place])  C61     2. Recurrent prostate adenocarcinoma ([place])  C61       HISTORY OF PRESENT ILLNESS: Paul Clay is a 80 y.o. male with locally recurrent prostate cancer. He was initially diagnosed with T1c, Gleason 3+3 adenocarcinoma of the prostate and 5 out of 12 cores on biopsy 01/21/2016 with Dr. Clyde Lundborg.  His PSA at the time of diagnosis was 5.6.  He elected to proceed with prostate IMRT in [facility] under the care of Dr. Quitman Livings, and completed 8 weeks of treatment between 04/14/2016 - 06/08/2016.  His PSA nadired at 0.8 in February 2019 but has been slowly rising since that time with a PSA of 1.0 in October 2019, 1.2 in October 2020, 1.75 in May 2021 and most recently, up to 2.89 in June 2022.  He was referred to Dr. Alyson Ingles and urology on 12/10/2020 who recommended restaging with CT A/P and bone scan.  The scans were performed on 01/20/2021 showing a sclerotic lesion in the left posterior medial 12th rib, concerning for oligometastatic disease but no other evidence of visceral or osseous metastasis.  A PSMA PET scan was performed on 03/02/2021 for further evaluation and this shows a local recurrence in the left posterior lateral apex of the prostate but no evidence of skeletal metastasis.  The patient reviewed the imaging results with his urologist and he has kindly been referred today for discussion of potential salvage radiation treatment options.  PREVIOUS RADIATION THERAPY: Yes  04/14/16 - 06/08/16: The  prostate was treated to 78 Gy in 40 fractions under the care of of Dr. Quitman Livings in [place].  PAST MEDICAL HISTORY:  Past Medical History:  Diagnosis Date   Cancer ([place])    Diabetes mellitus without complication ([place])       PAST SURGICAL HISTORY:No past surgical history on file.  FAMILY HISTORY: No family history on file.  SOCIAL HISTORY:  Social History   Socioeconomic History   Marital status: Divorced    Spouse name: Not on file   Number of children: Not on file   Years of education: Not on file   Highest education level: Not on file  Occupational History   Not on file  Tobacco Use   Smoking status: Former    Types: Cigarettes    Quit date: 2000    Years since quitting: 23.0   Smokeless tobacco: Never  Substance and Sexual Activity   Alcohol use: Never   Drug use: Never   Sexual activity: Not on file  Other Topics Concern   Not on file  Social History Narrative   Not on file   Social Determinants of Health   Financial Resource Strain: Not on file  Food Insecurity: Not on file  Transportation Needs: Not on file  Physical Activity: Not on file  Stress: Not on file  Social Connections: Not on file  Intimate Partner Violence: Not on file    ALLERGIES: Patient has no known allergies.  MEDICATIONS:  Current Outpatient Medications  Medication Sig Dispense Refill   B Complex Vitamins (VITAMIN B-COMPLEX PO) Take 1 tablet by mouth daily.     clotrimazole-betamethasone (LOTRISONE) cream APPLY AS DIRECTED TWICE DAILY.     empagliflozin (JARDIANCE) 25 MG TABS tablet Take 25 mg by mouth daily.     glipiZIDE (GLUCOTROL XL) 10 MG 24 hr tablet Take 10 mg by mouth daily with breakfast.     glucose blood (ONETOUCH ULTRA) test strip 2 (two) times daily.     lisinopril (ZESTRIL) 5 MG tablet Take 5 mg by mouth daily.     metFORMIN (GLUCOPHAGE-XR) 500 MG 24 hr tablet Take 2,000 mg by mouth daily with breakfast.     tamsulosin (FLOMAX) 0.4 MG CAPS capsule Take 0.4 mg by mouth  daily.     cromolyn (OPTICROM) 4 % ophthalmic solution 1 drop 2 (two) times daily. (Patient not taking: Reported on 03/04/2021)     ibuprofen (ADVIL) 200 MG tablet Take by mouth.     No current facility-administered medications for this encounter.    REVIEW OF SYSTEMS:  On review of systems, the patient reports that he is doing well overall. He denies any chest pain, shortness of breath, cough, fevers, chills, night sweats, unintended weight changes. He denies any bowel disturbances, and denies abdominal pain, nausea or vomiting. He denies any new musculoskeletal or joint aches or pains. His IPSS was 5, indicating mild urinary symptoms. 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 A complete review of systems is obtained and is otherwise negative.    PHYSICAL EXAM:  Wt Readings from Last 3 Encounters:  03/04/21 236 lb (107 kg)   Temp Readings from Last 3 Encounters:  03/04/21 98 F (36.7 C) (Oral)  12/30/20 (!) 97.5 F (36.4 C)   BP Readings from Last 3 Encounters:  03/04/21 134/70  02/17/21 (!) 163/71  12/30/20 (!) 146/76   Pulse Readings from Last 3 Encounters:  03/04/21 80  02/17/21 79  12/30/20 67   Pain Assessment Pain Score: 0-No pain/10  In general this is a well appearing Caucasian male in no acute distress. He's alert and oriented x4 and appropriate throughout the examination. Cardiopulmonary assessment is negative for acute distress, and he exhibits normal effort.     KPS = 100  100 - Normal; no complaints; no evidence of disease. 90   - Able to carry on normal activity; minor signs or symptoms of disease. 80   - Normal activity with effort; some signs or symptoms of disease. 42   - Cares for self; unable to carry on normal activity or to do active work. 60   - Requires occasional assistance, but is able to care for most of his personal needs. 50   - Requires considerable assistance and frequent medical care. 70   - Disabled; requires special  care and assistance. 50   - Severely disabled; hospital admission is indicated although death not imminent. 3   - Very sick; hospital admission necessary; active supportive treatment necessary. 10   - Moribund; fatal processes progressing rapidly. 0     - Dead  Karnofsky DA, Abelmann Wray, Craver LS and Burchenal [facility] [phone]) The use of the nitrogen mustards in the palliative treatment of carcinoma: with particular reference to bronchogenic carcinoma Cancer 1 [id number]  LABORATORY DATA:  No results found for: WBC, HGB, HCT, MCV, PLT Lab Results  Component Value Date   NA 135 12/30/2020   K 4.6 12/30/2020   CL 101 12/30/2020   CO2  19 (L) 12/30/2020   No results found for: ALT, AST, GGT, ALKPHOS, BILITOT   RADIOGRAPHY: NM PET (PSMA) SKULL TO MID THIGH  Result Date: 03/03/2021 CLINICAL DATA:  Prostate carcinoma with biochemical recurrence. Previous radiation therapy. EXAM: NUCLEAR MEDICINE PET SKULL BASE TO THIGH TECHNIQUE: 9.5 mCi F18 Piflufolastat (Pylarify) was injected intravenously. Full-ring PET imaging was performed from the skull base to thigh after the radiotracer. CT data was obtained and used for attenuation correction and anatomic localization. COMPARISON:  CT on 01/20/2021 FINDINGS: NECK No radiotracer activity in neck lymph nodes. Incidental CT finding: None CHEST No radiotracer accumulation within mediastinal or hilar lymph nodes. No suspicious pulmonary nodules on the CT scan. Incidental CT finding: Biapical pleural-parenchymal scarring and mild emphysema. ABDOMEN/PELVIS Prostate: Several fiducial markers are noted in the prostate. A focal area of radiotracer uptake is seen in the left posterolateral apex of the prostate gland, which has SUV max of 4.8. This is consistent with local recurrence. Lymph nodes: No abnormal radiotracer accumulation within pelvic or abdominal nodes. Liver: No evidence of liver metastasis Incidental CT finding: Fluid attenuation right renal cysts. Aortic  atherosclerotic calcification noted. SKELETON No focal activity to suggest skeletal metastasis. An isolated site radiotracer uptake is seen at the left T12 costovertebral junction, which corresponds with old fracture deformity and degenerative changes on CT images. IMPRESSION: Focal radiotracer uptake in the left posterolateral apex of the prostate, consistent with local recurrence of carcinoma. No evidence of lymph node metastases or distant metastatic disease. Electronically Signed   By: Marlaine Hind M.D.   On: 03/03/2021 11:19      IMPRESSION/PLAN: 1. 80 y.o. 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 We discussed the patient's workup and outlined the nature of prostate cancer in this setting. The patient's recent PSMA PET scan demonstrates a local recurrence of disease in the left posterior lateral apex of the prostate but without evidence of osseous or visceral metastasis.  In light of this information, we discussed the recommendation for a repeat prostate biopsy to confirm recurrent prostate cancer, how aggressive it appears to be and how much of the prostate gland is involved.  It may be beneficial to obtain an MRI prostate prior to biopsy to allow for more targeted sampling in addition to the standard 12 core biopsy but would defer to Dr. Alyson Ingles regarding his preference.  It would also be helpful to obtain a repeat PSA to establish a current baseline and assess PSA velocity since his most recent PSA is from June 2022.  Pending the results of repeat biopsy, he would potentially be eligible for a variety of treatment options including ST-ADT in combination with prostate SBRT if there is only a limited amount of localized recurrence versus cryotherapy or hypofractionated prostate re-irradiation if he is proven to have more extensive involvement in the prostate. He and his daughter were encouraged to ask  questions that were answered to their stated satisfaction.  At the conclusion of our conversation, the patient is interested in moving forward with prostate MRI and repeat prostate biopsy.  He has a scheduled follow-up visit with Dr. Alyson Ingles on 03/20/2021 so we will share our discussion and recommendations with Dr. Alyson Ingles in hopes that the prostate MRI can be completed prior to that visit so that there is more information available at the time of follow-up.  He appears to have a good understanding of his disease and our recommendations  and is comfortable and in agreement with the stated plan.  We enjoyed meeting him and his daughter today and look forward to following his progress and continuing to participate in his care.  We personally spent 75 minutes in this encounter including chart review, reviewing radiological studies, meeting face-to-face with the patient, entering orders and completing documentation.    Nicholos Johns, PA-C    Tyler Pita, MD  [facility] Direct Dial: [phone]   Fax: [phone] [email or web address]   Skype   LinkedIn

## 2021-03-05 ENCOUNTER — Other Ambulatory Visit (HOSPITAL_COMMUNITY): Payer: Medicare Other

## 2021-03-09 ENCOUNTER — Other Ambulatory Visit: Payer: Self-pay | Admitting: Urology

## 2021-03-09 ENCOUNTER — Telehealth: Payer: Self-pay

## 2021-03-09 DIAGNOSIS — C61 Malignant neoplasm of prostate: Secondary | ICD-10-CM

## 2021-03-09 NOTE — Telephone Encounter (Signed)
Patient called and made aware.

## 2021-03-09 NOTE — Telephone Encounter (Signed)
-----   Message from Cleon Gustin, MD sent at 03/09/2021  9:18 AM EST ----- Regarding: RE: Prostate MRI and PSA I want to get the prostate MRi first. I placed the order ----- Message ----- From: Iris Pert, LPN Sent: 0/04/7739  28:78 AM EST To: Cleon Gustin, MD Subject: FW: Prostate MRI and PSA                       Would you like for me to schedule a PSA, Prostate MRI, or Prostate biopsy on this patient before his f/u with you on 03/24/21? ----- Message ----- From: Rennis Harding, RN Sent: 03/04/2021   4:55 PM EST To: Iris Pert, LPN Subject: Prostate MRI and PSA                           Hey Golden you had a good holiday.   We saw this patient today at the [facility] for a radiation consult.  This is the gentleman you help ensure got orders for the PET PSMA.  Thank you for that!  Ashlyn, PA-C has notified Dr. Alyson Ingles of the below message, but I wanted to keep you in the loop as well.   "The PSMA PET scan demonstrates a local recurrence of disease in the left posterior lateral apex of the prostate but without evidence of osseous or visceral metastasis.  In light of this information, we discussed the recommendation for a repeat PSA and prostate biopsy to confirm recurrent prostate cancer, how aggressive it appears to be and how much of the prostate gland is involved.  Prostate MRI may be beneficial prior to biopsy to allow for more targeted sampling in addition to the standard 12 core biopsy but will defer to you regarding your preference.  He has a scheduled follow-up visit with Dr. Alyson Ingles on 03/20/2021 and is hoping that the prostate MRI can be completed prior to that visit so that there is more information available at follow-up.  I have shared this information with Dr. Alyson Ingles but would be grateful if you could keep him on your radar to ensure that the prostate MRI and repeat PSA get ordered prior to the follow-up visit on 03/20/2021."  Are you able to help coordinate  a prostate MRI and repeat PSA get completed prior to visit on 1/14?  Let me know if I can help out with anything, and thank you!!    [facility] Prostate Nurse Navigator

## 2021-03-12 NOTE — Progress Notes (Signed)
Patient's cardiologist @ [place] left voicemail with the fax number for the MRI clearance to be faxed to due to having pace maker.  Fax number [phone].   RN updated central scheduling to provide them this number, they will fax clearance form.

## 2021-03-15 ENCOUNTER — Other Ambulatory Visit: Payer: Self-pay | Admitting: Urology

## 2021-03-15 DIAGNOSIS — C61 Malignant neoplasm of prostate: Secondary | ICD-10-CM

## 2021-03-16 NOTE — Progress Notes (Signed)
Got it, thanks!

## 2021-03-22 ENCOUNTER — Ambulatory Visit (HOSPITAL_COMMUNITY): Payer: Medicare Other

## 2021-03-24 ENCOUNTER — Ambulatory Visit: Payer: Medicare Other | Admitting: Urology

## 2021-03-25 DIAGNOSIS — I442 Atrioventricular block, complete: Secondary | ICD-10-CM | POA: Diagnosis not present

## 2021-03-25 DIAGNOSIS — Z45018 Encounter for adjustment and management of other part of cardiac pacemaker: Secondary | ICD-10-CM | POA: Diagnosis not present

## 2021-03-30 ENCOUNTER — Other Ambulatory Visit: Payer: Self-pay | Admitting: Urology

## 2021-03-30 ENCOUNTER — Telehealth: Payer: Self-pay

## 2021-03-30 MED ORDER — DIAZEPAM 10 MG PO TABS: 10.0000 mg | ORAL_TABLET | Freq: Once | ORAL | 0 refills | Status: AC

## 2021-03-30 NOTE — Telephone Encounter (Signed)
Patient scheduled for MRI but is requesting valium for anxiety related to MRI. Message sent to MD.

## 2021-04-07 ENCOUNTER — Other Ambulatory Visit: Payer: Self-pay | Admitting: Urology

## 2021-04-07 DIAGNOSIS — M79671 Pain in right foot: Secondary | ICD-10-CM | POA: Diagnosis not present

## 2021-04-07 DIAGNOSIS — M79675 Pain in left toe(s): Secondary | ICD-10-CM | POA: Diagnosis not present

## 2021-04-07 DIAGNOSIS — M79674 Pain in right toe(s): Secondary | ICD-10-CM | POA: Diagnosis not present

## 2021-04-07 DIAGNOSIS — E1140 Type 2 diabetes mellitus with diabetic neuropathy, unspecified: Secondary | ICD-10-CM | POA: Diagnosis not present

## 2021-04-07 DIAGNOSIS — L110 Acquired keratosis follicularis: Secondary | ICD-10-CM | POA: Diagnosis not present

## 2021-04-07 DIAGNOSIS — M79672 Pain in left foot: Secondary | ICD-10-CM | POA: Diagnosis not present

## 2021-04-07 DIAGNOSIS — E1151 Type 2 diabetes mellitus with diabetic peripheral angiopathy without gangrene: Secondary | ICD-10-CM | POA: Diagnosis not present

## 2021-04-07 DIAGNOSIS — L609 Nail disorder, unspecified: Secondary | ICD-10-CM | POA: Diagnosis not present

## 2021-04-07 MED ORDER — DIAZEPAM 10 MG PO TABS: 10.0000 mg | ORAL_TABLET | Freq: Once | ORAL | 0 refills | Status: AC

## 2021-04-14 ENCOUNTER — Other Ambulatory Visit: Payer: Self-pay

## 2021-04-14 ENCOUNTER — Ambulatory Visit (HOSPITAL_COMMUNITY)
Admission: RE | Admit: 2021-04-14 | Discharge: 2021-04-14 | Disposition: A | Discharge status: Home / Self Care | Payer: Medicare Other | Source: Ambulatory Visit | Attending: Student | Admitting: Student

## 2021-04-14 ENCOUNTER — Ambulatory Visit (HOSPITAL_COMMUNITY)
Admission: RE | Admit: 2021-04-14 | Discharge: 2021-04-14 | Disposition: A | Discharge status: Home / Self Care | Payer: Medicare Other | Source: Ambulatory Visit | Attending: Urology | Admitting: Urology

## 2021-04-14 DIAGNOSIS — C61 Malignant neoplasm of prostate: Secondary | ICD-10-CM | POA: Insufficient documentation

## 2021-04-14 DIAGNOSIS — I517 Cardiomegaly: Secondary | ICD-10-CM | POA: Diagnosis not present

## 2021-04-14 DIAGNOSIS — R590 Localized enlarged lymph nodes: Secondary | ICD-10-CM | POA: Diagnosis not present

## 2021-04-14 DIAGNOSIS — Z950 Presence of cardiac pacemaker: Secondary | ICD-10-CM | POA: Diagnosis not present

## 2021-04-14 DIAGNOSIS — K5730 Diverticulosis of large intestine without perforation or abscess without bleeding: Secondary | ICD-10-CM | POA: Diagnosis not present

## 2021-04-14 MED ORDER — GADOBUTROL 1 MMOL/ML IV SOLN
10.0000 mL | Freq: Once | INTRAVENOUS | Status: AC | PRN
  Administered 2021-04-14: 10 mL via INTRAVENOUS

## 2021-04-14 NOTE — Progress Notes (Signed)
Informed of MRI for today.   Device system confirmed to be MRI conditional, with implant date > 6 weeks ago, and no evidence of abandoned or epicardial leads in review of most recent CXR Interrogation from today reviewed, pt is currently AP-VS at ~72 bpm Change device settings for MRI to DOO at 90 bpm  Tachy-therapies to off if applicable.  Program device back to pre-MRI settings after completion of exam.  Annamaria Helling  04/14/2021 12:37 PM

## 2021-04-14 NOTE — Progress Notes (Signed)
Orders received.  Pt placed in MRI safe mode DOO 90.  Will take out of safe mode and reprogram post scan.  Will send interogattion

## 2021-04-16 NOTE — Progress Notes (Signed)
Voicemail left for [facility] to inquire about moving follow up appointment up.

## 2021-04-23 ENCOUNTER — Ambulatory Visit: Payer: Medicare Other | Admitting: Urology

## 2021-04-23 ENCOUNTER — Other Ambulatory Visit: Payer: Self-pay

## 2021-04-23 VITALS — BP 150/73 | HR 84 | Wt 235.0 lb

## 2021-04-23 DIAGNOSIS — R339 Retention of urine, unspecified: Secondary | ICD-10-CM

## 2021-04-23 DIAGNOSIS — N138 Other obstructive and reflux uropathy: Secondary | ICD-10-CM

## 2021-04-23 DIAGNOSIS — C61 Malignant neoplasm of prostate: Secondary | ICD-10-CM

## 2021-04-23 DIAGNOSIS — R972 Elevated prostate specific antigen [PSA]: Secondary | ICD-10-CM

## 2021-04-23 DIAGNOSIS — N401 Enlarged prostate with lower urinary tract symptoms: Secondary | ICD-10-CM | POA: Diagnosis not present

## 2021-04-23 LAB — BLADDER SCAN AMB NON-IMAGING: Scan Result: 395

## 2021-04-23 MED ORDER — TAMSULOSIN HCL 0.4 MG PO CAPS: 0.4000 mg | ORAL_CAPSULE | Freq: Every day | ORAL | 11 refills | Status: DC

## 2021-04-23 NOTE — Progress Notes (Signed)
b

## 2021-04-23 NOTE — Progress Notes (Signed)
04/23/2021 1:19 PM   Paul Clay 1940/07/23 [id number]  Referring provider: Manon Hilding, MD [place],  [place] [address]  Followup prostate cancer   HPI: Paul Clay is a 81yo here for followup for prostate cancer. He underwent prostate MRI which showed a 1cm PIRADS4 lesion on the left posterior lateral peripheral zone. This corresponds to the same lesion on PSMA PET. He denies any worsening LUTS. No hematuria or dysuria. No other complaints today   PMH: Past Medical History:  Diagnosis Date   Cancer ([place])    Diabetes mellitus without complication ([place])     Surgical History: No past surgical history on file.  Home Medications:  Allergies as of 04/23/2021   No Known Allergies      Medication List        Accurate as of April 23, 2021  1:19 PM. If you have any questions, ask your nurse or doctor.          clotrimazole-betamethasone cream Commonly known as: LOTRISONE APPLY AS DIRECTED TWICE DAILY.   cromolyn 4 % ophthalmic solution Commonly known as: OPTICROM 1 drop 2 (two) times daily.   empagliflozin 25 MG Tabs tablet Commonly known as: JARDIANCE Take 25 mg by mouth daily.   glipiZIDE 10 MG 24 hr tablet Commonly known as: GLUCOTROL XL Take 10 mg by mouth daily with breakfast.   ibuprofen 200 MG tablet Commonly known as: ADVIL Take by mouth.   lisinopril 5 MG tablet Commonly known as: ZESTRIL Take 5 mg by mouth daily.   metFORMIN 500 MG 24 hr tablet Commonly known as: GLUCOPHAGE-XR Take 2,000 mg by mouth daily with breakfast.   OneTouch Ultra test strip Generic drug: glucose blood 2 (two) times daily.   tamsulosin 0.4 MG Caps capsule Commonly known as: FLOMAX Take 0.4 mg by mouth daily.   VITAMIN B-COMPLEX PO Take 1 tablet by mouth daily.        Allergies: No Known Allergies  Family History: No family history on file.  Social History:  reports that he quit smoking about 23 years ago. His smoking use included  cigarettes. He has never used smokeless tobacco. He reports that he does not drink alcohol and does not use drugs.  ROS: All other review of systems were reviewed and are negative except what is noted above in HPI  Physical Exam: BP (!) 150/73    Pulse 84    Wt 235 lb (106.6 kg)    BMI 33.72 kg/m   Constitutional:  Alert and oriented, No acute distress. HEENT: [place] AT, moist mucus membranes.  Trachea midline, no masses. Cardiovascular: No clubbing, cyanosis, or edema. Respiratory: Normal respiratory effort, no increased work of breathing. GI: Abdomen is soft, nontender, nondistended, no abdominal masses GU: No CVA tenderness.  Lymph: No cervical or inguinal lymphadenopathy. Skin: No rashes, bruises or suspicious lesions. Neurologic: Grossly intact, no focal deficits, moving all 4 extremities. Psychiatric: Normal mood and affect.  Laboratory Data: No results found for: WBC, HGB, HCT, MCV, PLT  Lab Results  Component Value Date   CREATININE 1.61 (H) 12/30/2020    No results found for: PSA  No results found for: TESTOSTERONE  No results found for: HGBA1C  Urinalysis    Component Value Date/Time   APPEARANCEUR Clear 12/30/2020 1438   GLUCOSEU 3+ (A) 12/30/2020 1438   BILIRUBINUR Negative 12/30/2020 1438   PROTEINUR Negative 12/30/2020 1438   NITRITE Negative 12/30/2020 1438   LEUKOCYTESUR Negative 12/30/2020 1438  Lab Results  Component Value Date   LABMICR Comment 12/30/2020    Pertinent Imaging: MRI 04/14/2021: Images reviewed and discussed with the patient  No results found for this or any previous visit.  No results found for this or any previous visit.  No results found for this or any previous visit.  No results found for this or any previous visit.  No results found for this or any previous visit.  No results found for this or any previous visit.  No results found for this or any previous visit.  No results found for this or any previous  visit.   Assessment & Plan:    1. Prostate cancer [facility]) We discussed the management of recurrent prostate cancer and the workup including prostate biopsy. Currently the patient does not wish to pursue prostate biopsy and further therapy at this time. RTC 3 months with PSA - BLADDER SCAN AMB NON-IMAGING  2. BPH with incomplete emptying -Continue flomax 0.4mg  daily    No follow-ups on file.  Nicolette Bang, MD  [facility] [place]

## 2021-04-24 LAB — PSA: Prostate Specific Ag, Serum: 6.3 ng/mL — ABNORMAL HIGH (ref 0.0–4.0)

## 2021-04-27 ENCOUNTER — Encounter: Payer: Self-pay | Admitting: Urology

## 2021-04-27 NOTE — Patient Instructions (Signed)
Prostate Cancer °The prostate is a small gland that helps make semen. It is located below a man's bladder, in front of the rectum. Prostate cancer is when abnormal cells grow in this gland. °What are the causes? °The cause of this condition is not known. °What increases the risk? °Being age 81 or older. °Having a family history of prostate cancer. °Having a family history of cancer of the breasts or ovaries. °Having genes that are passed from parent to child (inherited). °Having Lynch syndrome. °African American men and men of African descent are diagnosed with prostate cancer at higher rates than other men. °What are the signs or symptoms? °Problems peeing (urinating). This may include: °A stream that is weak, or pee that stops and starts. °Trouble starting or stopping your pee. °Trouble emptying all of your pee. °Needing to pee more often, especially at night. °Blood in your pee or semen. °Pain in the: °Lower back. °Lower belly (abdomen). °Hips. °Trouble getting an erection. °Weakness or numbness in the legs or feet. °How is this treated? °Treatment for this condition depends on: °How much the cancer has spread. °Your age. °The kind of treatment you want. °Your health. °Treatments include: °Being watched. This is called observation. You will be tested from time to time, but you will not get treated. Tests are to make sure that the cancer is not growing. °Surgery. This may be done to: °Take out (remove) the prostate. °Freeze and kill cancer cells. °Radiation. This uses a strong beam of energy to kill cancer cells. °Chemotherapy. This uses medicines that stop cancer cells from increasing. This kills cancer cells and healthy cells. °Targeted therapy. This kills cancer cells only. Healthy cells are not affected. °Hormone treatment. This stops the body from making hormones that help the cancer cells grow. °Follow these instructions at home: °Lifestyle °Do not smoke or use any products that contain nicotine or tobacco.  If you need help quitting, ask your doctor. °Eat a healthy diet. °Treatment may affect your ability to have sex. If you have a partner, touch, hold, hug, and caress your partner to have intimate moments. °Get plenty of sleep. °Ask your doctor for help to find a support group for men with prostate cancer. °General instructions °Take over-the-counter and prescription medicines only as told by your doctor. °If you have to go to the hospital, let your cancer doctor (oncologist) know. °Keep all follow-up visits. °Where to find more information °American Cancer Society: www.cancer.org °American Society of Clinical Oncology: www.cancer.net °National Cancer Institute: www.cancer.gov °Contact a doctor if: °You have new or more trouble peeing. °You have new or more blood in your pee. °You have new or more pain in your hips, back, or chest. °Get help right away if: °You have weakness in your legs. °You lose feeling in your legs. °You cannot control your pee or your poop (stool). °You have chills or a fever. °Summary °The prostate is a male gland that helps make semen. °Prostate cancer is when abnormal cells grow in this gland. °Treatment includes doing surgery, using medicines, using strong beams of energy, or watching without treatment. °Ask your doctor for help to find a support group for men with prostate cancer. °Contact a doctor if you have problems peeing or have any new pain that you did not have before. °This information is not intended to replace advice given to you by your health care provider. Make sure you discuss any questions you have with your health care provider. °Document Revised: 05/20/2020 Document Reviewed: 05/20/2020 °Elsevier   Patient Education © 2022 [employer]. ° °

## 2021-05-03 ENCOUNTER — Ambulatory Visit (HOSPITAL_COMMUNITY): Payer: Medicare Other

## 2021-05-04 ENCOUNTER — Ambulatory Visit: Payer: Medicare Other | Admitting: Urology

## 2021-05-11 DIAGNOSIS — M1711 Unilateral primary osteoarthritis, right knee: Secondary | ICD-10-CM | POA: Diagnosis not present

## 2021-05-21 ENCOUNTER — Ambulatory Visit: Payer: Medicare Other | Admitting: Urology

## 2021-06-23 DIAGNOSIS — M79675 Pain in left toe(s): Secondary | ICD-10-CM | POA: Diagnosis not present

## 2021-06-23 DIAGNOSIS — M79672 Pain in left foot: Secondary | ICD-10-CM | POA: Diagnosis not present

## 2021-06-23 DIAGNOSIS — E1151 Type 2 diabetes mellitus with diabetic peripheral angiopathy without gangrene: Secondary | ICD-10-CM | POA: Diagnosis not present

## 2021-06-23 DIAGNOSIS — M79674 Pain in right toe(s): Secondary | ICD-10-CM | POA: Diagnosis not present

## 2021-06-23 DIAGNOSIS — L609 Nail disorder, unspecified: Secondary | ICD-10-CM | POA: Diagnosis not present

## 2021-06-23 DIAGNOSIS — M79671 Pain in right foot: Secondary | ICD-10-CM | POA: Diagnosis not present

## 2021-06-23 DIAGNOSIS — L110 Acquired keratosis follicularis: Secondary | ICD-10-CM | POA: Diagnosis not present

## 2021-06-24 DIAGNOSIS — Z45018 Encounter for adjustment and management of other part of cardiac pacemaker: Secondary | ICD-10-CM | POA: Diagnosis not present

## 2021-07-13 ENCOUNTER — Other Ambulatory Visit: Payer: Medicare Other

## 2021-07-14 ENCOUNTER — Other Ambulatory Visit: Payer: Medicare Other

## 2021-07-14 LAB — PSA: Prostate Specific Ag, Serum: 8.7 ng/mL — ABNORMAL HIGH (ref 0.0–4.0)

## 2021-07-21 ENCOUNTER — Ambulatory Visit: Payer: Medicare Other | Admitting: Urology

## 2021-07-28 ENCOUNTER — Ambulatory Visit: Payer: Medicare Other | Admitting: Urology

## 2021-07-28 ENCOUNTER — Encounter: Payer: Self-pay | Admitting: Urology

## 2021-07-28 VITALS — BP 165/68 | HR 83

## 2021-07-28 DIAGNOSIS — R339 Retention of urine, unspecified: Secondary | ICD-10-CM

## 2021-07-28 DIAGNOSIS — C61 Malignant neoplasm of prostate: Secondary | ICD-10-CM

## 2021-07-28 DIAGNOSIS — N401 Enlarged prostate with lower urinary tract symptoms: Secondary | ICD-10-CM | POA: Diagnosis not present

## 2021-07-28 DIAGNOSIS — N138 Other obstructive and reflux uropathy: Secondary | ICD-10-CM

## 2021-07-28 NOTE — Progress Notes (Signed)
07/28/2021 11:06 AM   Paul Clay 1940-08-31 [id number]  Referring provider: Manon Hilding, MD [place],  [place] [id number]  Followup prostate cancer   HPI: Paul Clay is a 81yo here for followup for prostate cancer. PSA increased to 8.7 from 6.3 in 3 months. IPSS 14 QOl 2 on flomax 0.'4mg'$  daily. No bone pain.  No other complaints today.    PMH: Past Medical History:  Diagnosis Date   Cancer ([place])    Diabetes mellitus without complication ([place])     Surgical History: No past surgical history on file.  Home Medications:  Allergies as of 07/28/2021   No Known Allergies      Medication List        Accurate as of Jul 28, 2021 11:06 AM. If you have any questions, ask your nurse or doctor.          clotrimazole-betamethasone cream Commonly known as: LOTRISONE APPLY AS DIRECTED TWICE DAILY.   cromolyn 4 % ophthalmic solution Commonly known as: OPTICROM 1 drop 2 (two) times daily.   empagliflozin 25 MG Tabs tablet Commonly known as: JARDIANCE Take 25 mg by mouth daily.   glipiZIDE 10 MG 24 hr tablet Commonly known as: GLUCOTROL XL Take 10 mg by mouth daily with breakfast.   ibuprofen 200 MG tablet Commonly known as: ADVIL Take by mouth.   lisinopril 5 MG tablet Commonly known as: ZESTRIL Take 5 mg by mouth daily.   metFORMIN 500 MG 24 hr tablet Commonly known as: GLUCOPHAGE-XR Take 2,000 mg by mouth daily with breakfast.   OneTouch Ultra test strip Generic drug: glucose blood 2 (two) times daily.   tamsulosin 0.4 MG Caps capsule Commonly known as: FLOMAX Take 1 capsule (0.4 mg total) by mouth daily.   VITAMIN B-COMPLEX PO Take 1 tablet by mouth daily.        Allergies: No Known Allergies  Family History: No family history on file.  Social History:  reports that he quit smoking about 23 years ago. His smoking use included cigarettes. He has never used smokeless tobacco. He reports that he does not drink alcohol and does not use  drugs.  ROS: All other review of systems were reviewed and are negative except what is noted above in HPI  Physical Exam: BP (!) 165/68   Pulse 83   Constitutional:  Alert and oriented, No acute distress. HEENT: [place] AT, moist mucus membranes.  Trachea midline, no masses. Cardiovascular: No clubbing, cyanosis, or edema. Respiratory: Normal respiratory effort, no increased work of breathing. GI: Abdomen is soft, nontender, nondistended, no abdominal masses GU: No CVA tenderness.  Lymph: No cervical or inguinal lymphadenopathy. Skin: No rashes, bruises or suspicious lesions. Neurologic: Grossly intact, no focal deficits, moving all 4 extremities. Psychiatric: Normal mood and affect.  Laboratory Data: No results found for: WBC, HGB, HCT, MCV, PLT  Lab Results  Component Value Date   CREATININE 1.61 (H) 12/30/2020    No results found for: PSA  No results found for: TESTOSTERONE  No results found for: HGBA1C  Urinalysis    Component Value Date/Time   APPEARANCEUR Clear 12/30/2020 1438   GLUCOSEU 3+ (A) 12/30/2020 1438   BILIRUBINUR Negative 12/30/2020 1438   PROTEINUR Negative 12/30/2020 1438   NITRITE Negative 12/30/2020 1438   LEUKOCYTESUR Negative 12/30/2020 1438    Lab Results  Component Value Date   LABMICR Comment 12/30/2020    Pertinent Imaging:  No results found for this or any previous visit.  No results found for this or any previous visit.  No results found for this or any previous visit.  No results found for this or any previous visit.  No results found for this or any previous visit.  No results found for this or any previous visit.  No results found for this or any previous visit.  No results found for this or any previous visit.   Assessment & Plan:    1. Prostate cancer ([place]) -We discussed MRI fusion biopsy versus surveillance and the patient elects for surveillance. RTC 3 months with PSA - Urinalysis, Routine w reflex  microscopic  2. Benign prostatic hyperplasia with urinary obstruction Continue flomax 0.'4mg'$  daily  3. Incomplete emptying of bladder -continue flomax 0.'4mg'$  daily.    No follow-ups on file.  Nicolette Bang, MD  [facility] [place]

## 2021-07-28 NOTE — Patient Instructions (Signed)

## 2021-07-29 LAB — URINALYSIS, ROUTINE W REFLEX MICROSCOPIC
Bilirubin, UA: NEGATIVE
Ketones, UA: NEGATIVE
Leukocytes,UA: NEGATIVE
Nitrite, UA: NEGATIVE
Protein,UA: NEGATIVE
RBC, UA: NEGATIVE
Specific Gravity, UA: 1.015 (ref 1.005–1.030)
Urobilinogen, Ur: 0.2 mg/dL (ref 0.2–1.0)
pH, UA: 6 (ref 5.0–7.5)

## 2021-09-01 DIAGNOSIS — I739 Peripheral vascular disease, unspecified: Secondary | ICD-10-CM | POA: Diagnosis not present

## 2021-09-01 DIAGNOSIS — M79674 Pain in right toe(s): Secondary | ICD-10-CM | POA: Diagnosis not present

## 2021-09-01 DIAGNOSIS — M79671 Pain in right foot: Secondary | ICD-10-CM | POA: Diagnosis not present

## 2021-09-01 DIAGNOSIS — L110 Acquired keratosis follicularis: Secondary | ICD-10-CM | POA: Diagnosis not present

## 2021-09-01 DIAGNOSIS — E1140 Type 2 diabetes mellitus with diabetic neuropathy, unspecified: Secondary | ICD-10-CM | POA: Diagnosis not present

## 2021-09-01 DIAGNOSIS — M79675 Pain in left toe(s): Secondary | ICD-10-CM | POA: Diagnosis not present

## 2021-09-01 DIAGNOSIS — M79672 Pain in left foot: Secondary | ICD-10-CM | POA: Diagnosis not present

## 2021-09-23 DIAGNOSIS — Z45018 Encounter for adjustment and management of other part of cardiac pacemaker: Secondary | ICD-10-CM | POA: Diagnosis not present

## 2021-10-22 ENCOUNTER — Other Ambulatory Visit: Payer: Medicare Other

## 2021-10-29 ENCOUNTER — Ambulatory Visit: Payer: Medicare Other | Admitting: Urology

## 2021-11-22 ENCOUNTER — Other Ambulatory Visit: Payer: Medicare Other

## 2021-11-23 LAB — PSA: Prostate Specific Ag, Serum: 9 ng/mL — ABNORMAL HIGH (ref 0.0–4.0)

## 2021-11-29 ENCOUNTER — Ambulatory Visit: Payer: Medicare Other | Admitting: Urology

## 2021-11-29 ENCOUNTER — Encounter: Payer: Self-pay | Admitting: Urology

## 2021-11-29 VITALS — BP 124/74 | HR 69

## 2021-11-29 DIAGNOSIS — N138 Other obstructive and reflux uropathy: Secondary | ICD-10-CM

## 2021-11-29 DIAGNOSIS — N401 Enlarged prostate with lower urinary tract symptoms: Secondary | ICD-10-CM

## 2021-11-29 DIAGNOSIS — R339 Retention of urine, unspecified: Secondary | ICD-10-CM | POA: Diagnosis not present

## 2021-11-29 DIAGNOSIS — C61 Malignant neoplasm of prostate: Secondary | ICD-10-CM | POA: Diagnosis not present

## 2021-11-29 LAB — BLADDER SCAN AMB NON-IMAGING: Scan Result: 559

## 2021-11-29 MED ORDER — TAMSULOSIN HCL 0.4 MG PO CAPS: 0.4000 mg | ORAL_CAPSULE | Freq: Two times a day (BID) | ORAL | 11 refills | Status: DC

## 2021-11-29 NOTE — Progress Notes (Unsigned)
post void residual=559

## 2021-11-29 NOTE — Patient Instructions (Signed)

## 2021-11-29 NOTE — Progress Notes (Unsigned)
11/29/2021 3:56 PM   Odessa Fleming Aug 13, 1940 [id number]  Referring provider: Manon Hilding, MD [place],  [place] [address]  Followup prostate cancer and BPH   HPI: Paul Clay is a 81yo here for followup for prostate cancer and BPH. PSA increased to 9.0 from 8.7 three months ago. IPSS 6 QOl 1. PVR 550cc. Urine stream fair. No straining to urinate.    PMH: Past Medical History:  Diagnosis Date   Cancer ([place])    Diabetes mellitus without complication ([place])     Surgical History: No past surgical history on file.  Home Medications:  Allergies as of 11/29/2021   No Known Allergies      Medication List        Accurate as of November 29, 2021  3:56 PM. If you have any questions, ask your nurse or doctor.          clotrimazole-betamethasone cream Commonly known as: LOTRISONE APPLY AS DIRECTED TWICE DAILY.   cromolyn 4 % ophthalmic solution Commonly known as: OPTICROM 1 drop 2 (two) times daily.   empagliflozin 25 MG Tabs tablet Commonly known as: JARDIANCE Take 25 mg by mouth daily.   glipiZIDE 10 MG 24 hr tablet Commonly known as: GLUCOTROL XL Take 10 mg by mouth daily with breakfast.   ibuprofen 200 MG tablet Commonly known as: ADVIL Take by mouth.   lisinopril 5 MG tablet Commonly known as: ZESTRIL Take 5 mg by mouth daily.   metFORMIN 500 MG 24 hr tablet Commonly known as: GLUCOPHAGE-XR Take 2,000 mg by mouth daily with breakfast.   OneTouch Ultra test strip Generic drug: glucose blood 2 (two) times daily.   tamsulosin 0.4 MG Caps capsule Commonly known as: FLOMAX Take 1 capsule (0.4 mg total) by mouth daily.   VITAMIN B-COMPLEX PO Take 1 tablet by mouth daily.        Allergies: No Known Allergies  Family History: No family history on file.  Social History:  reports that he quit smoking about 23 years ago. His smoking use included cigarettes. He has never used smokeless tobacco. He reports that he does not drink alcohol  and does not use drugs.  ROS: All other review of systems were reviewed and are negative except what is noted above in HPI  Physical Exam: BP 124/74   Pulse 69   Constitutional:  Alert and oriented, No acute distress. HEENT: [place] AT, moist mucus membranes.  Trachea midline, no masses. Cardiovascular: No clubbing, cyanosis, or edema. Respiratory: Normal respiratory effort, no increased work of breathing. GI: Abdomen is soft, nontender, nondistended, no abdominal masses GU: No CVA tenderness.  Lymph: No cervical or inguinal lymphadenopathy. Skin: No rashes, bruises or suspicious lesions. Neurologic: Grossly intact, no focal deficits, moving all 4 extremities. Psychiatric: Normal mood and affect.  Laboratory Data: No results found for: "WBC", "HGB", "HCT", "MCV", "PLT"  Lab Results  Component Value Date   CREATININE 1.61 (H) 12/30/2020    No results found for: "PSA"  No results found for: "TESTOSTERONE"  No results found for: "HGBA1C"  Urinalysis    Component Value Date/Time   APPEARANCEUR Clear 07/29/2021 0840   GLUCOSEU 3+ (A) 07/29/2021 0840   BILIRUBINUR Negative 07/29/2021 0840   PROTEINUR Negative 07/29/2021 0840   NITRITE Negative 07/29/2021 0840   LEUKOCYTESUR Negative 07/29/2021 0840    Lab Results  Component Value Date   LABMICR Comment 07/29/2021    Pertinent Imaging: *** No results found for this or any previous  visit.  No results found for this or any previous visit.  No results found for this or any previous visit.  No results found for this or any previous visit.  No results found for this or any previous visit.  No valid procedures specified. No results found for this or any previous visit.  No results found for this or any previous visit.   Assessment & Plan:    1. Prostate cancer ([place]) -RTC 3 months with PSA - Urinalysis, Routine w reflex microscopic  2. Benign prostatic hyperplasia with urinary obstruction   3. Incomplete  emptying of bladder -Increase flomax to 0.'4mg'$  BID   No follow-ups on file.  Nicolette Bang, MD  [facility] [place]

## 2021-11-30 LAB — URINALYSIS, ROUTINE W REFLEX MICROSCOPIC
Bilirubin, UA: NEGATIVE
Ketones, UA: NEGATIVE
Leukocytes,UA: NEGATIVE
Nitrite, UA: NEGATIVE
Protein,UA: NEGATIVE
RBC, UA: NEGATIVE
Specific Gravity, UA: 1.01 (ref 1.005–1.030)
Urobilinogen, Ur: 1 mg/dL (ref 0.2–1.0)
pH, UA: 5.5 (ref 5.0–7.5)

## 2021-12-12 IMAGING — CT CT ABD-PEL WO/W CM
3 of 9 series · 12 of 46 positions shown, 18 images · IV contrast (omnipaque)
Comparison: None.

CLINICAL DATA: Elevated PSA with history of prostate cancer.

EXAM:
CT ABDOMEN AND PELVIS WITHOUT AND WITH CONTRAST
TECHNIQUE: Multidetector CT imaging of the abdomen and pelvis was performed
following the standard protocol before and following the bolus
administration of intravenous contrast.
CONTRAST:  75mL OMNIPAQUE IOHEXOL 300 MG/ML  SOLN

[Series 3: axial st · axial · 0.85mm/px · z∈[+983,+1348]mm · 6 of 103 slices shown, 11 images (1 of 2)]
[im 15/103  soft-tissue]
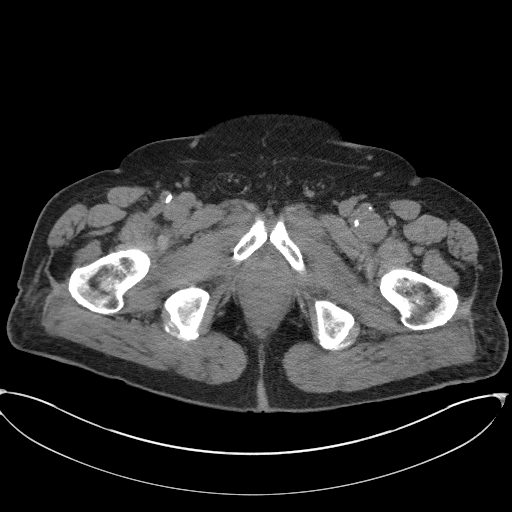
[im 15/103  bone]
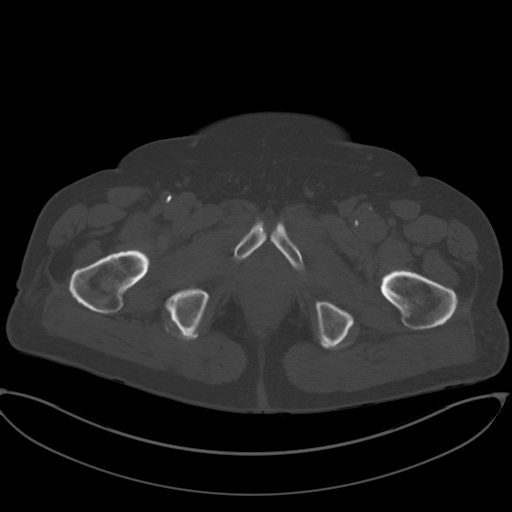
[im 30/103  soft-tissue]
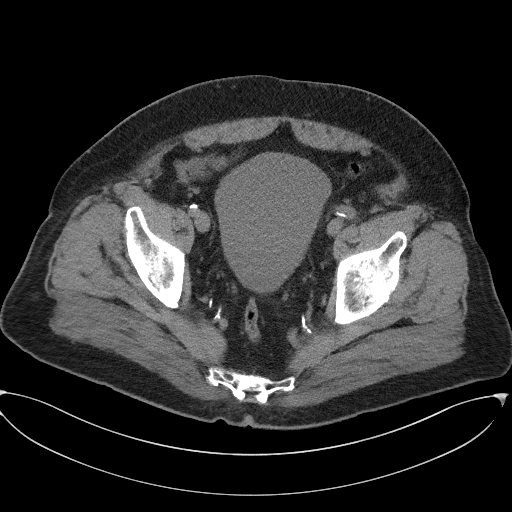
[im 44/103  soft-tissue]
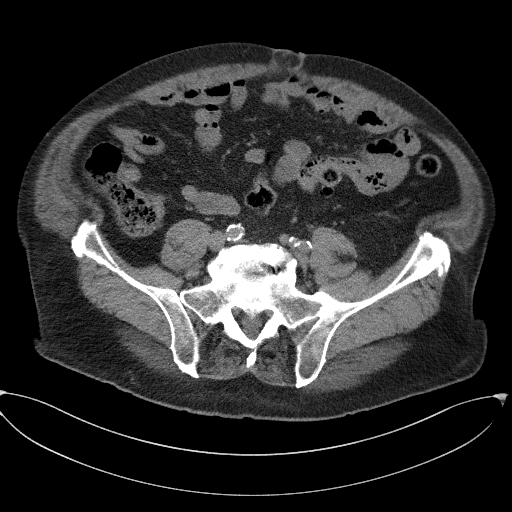
[im 44/103  lung]
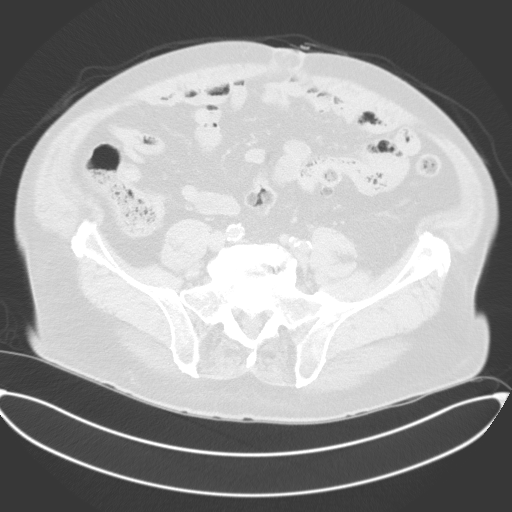
[im 59/103  soft-tissue]
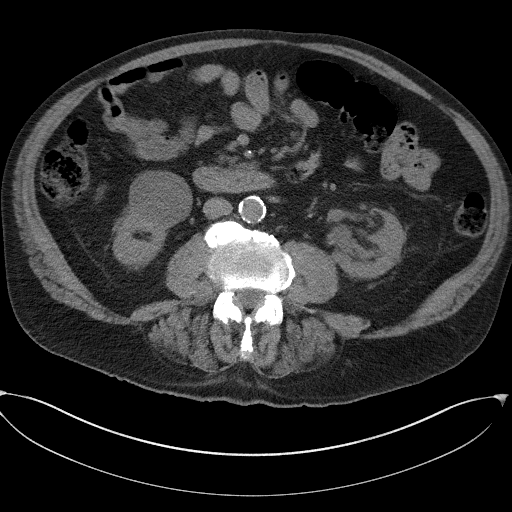
[im 59/103  lung]
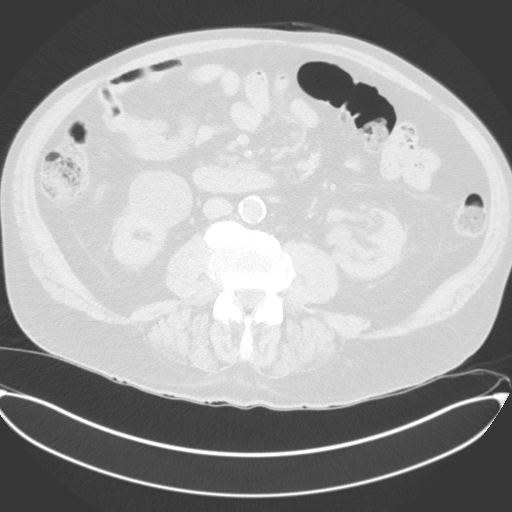
[im 73/103  soft-tissue]
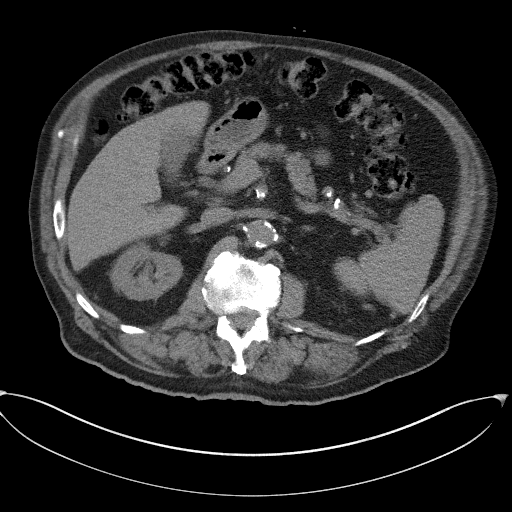
[im 73/103  lung]
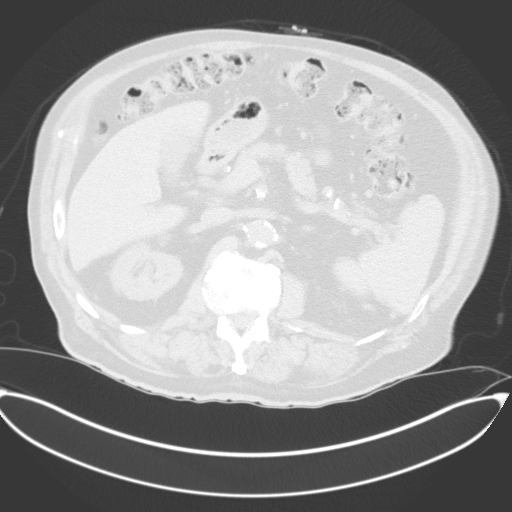
[im 88/103  soft-tissue]
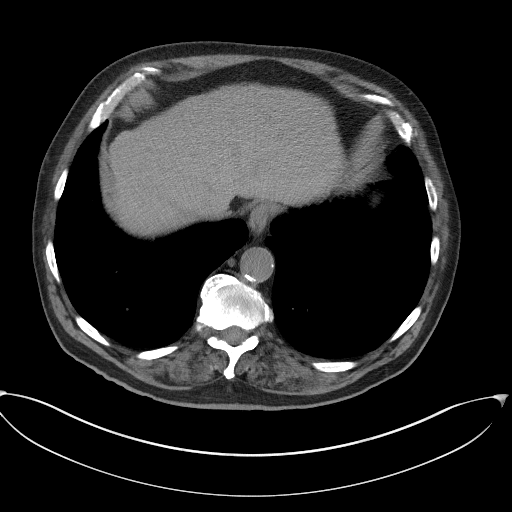
[im 88/103  lung]
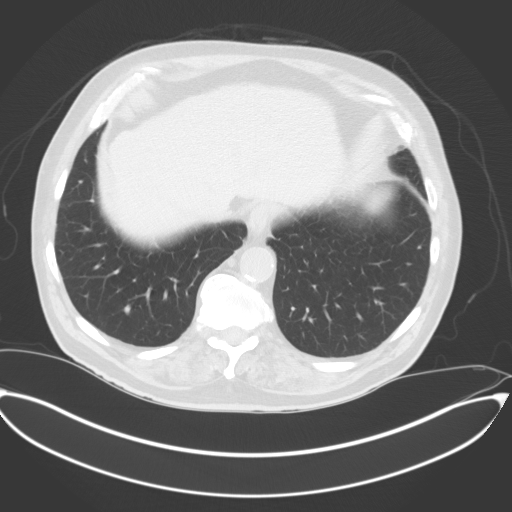

[Series 4: axial st · axial · 0.85mm/px · z∈[+998,+1168]mm · 3 of 103 slices shown (2 of 2)]
[im 18/103  soft-tissue]
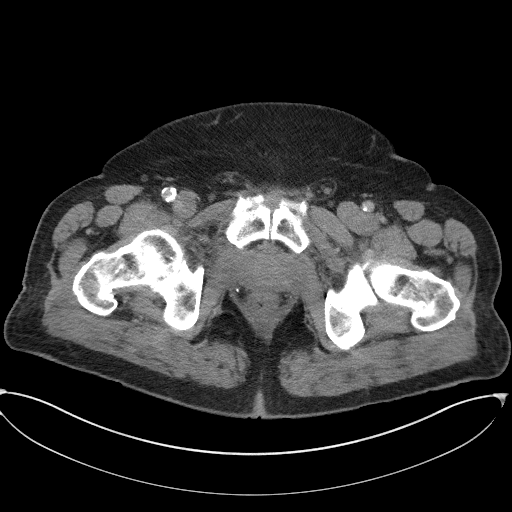
[im 35/103  soft-tissue]
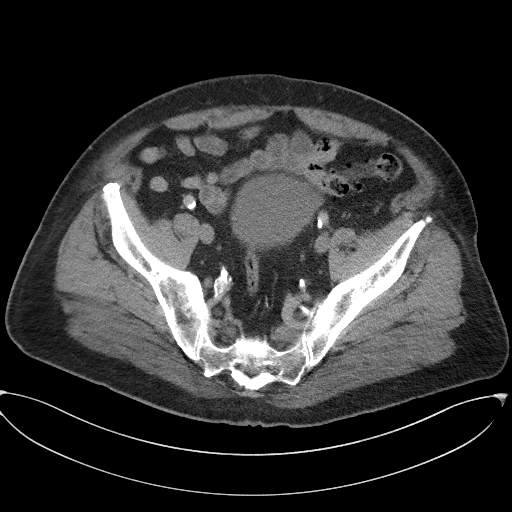
[im 52/103  soft-tissue]
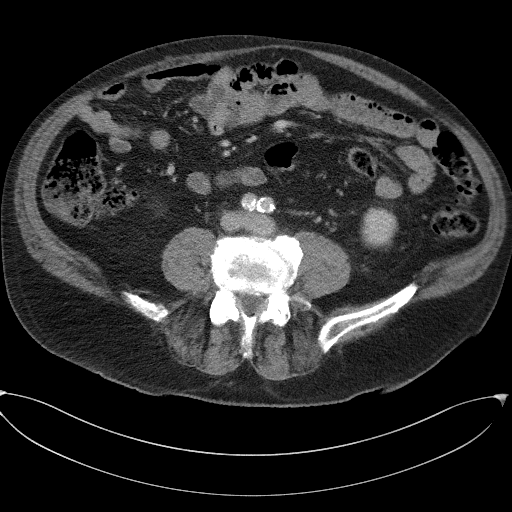

[Series 5: coronal st · coronal · 0.96mm/px · 3 of 123 slices shown, 4 images]
[im 31/123  soft-tissue]
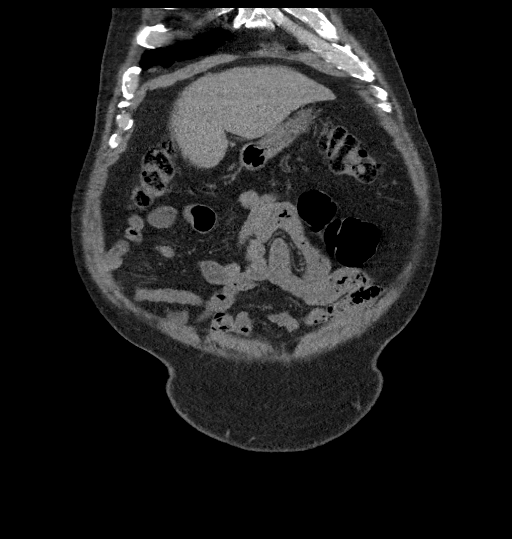
[im 62/123  soft-tissue]
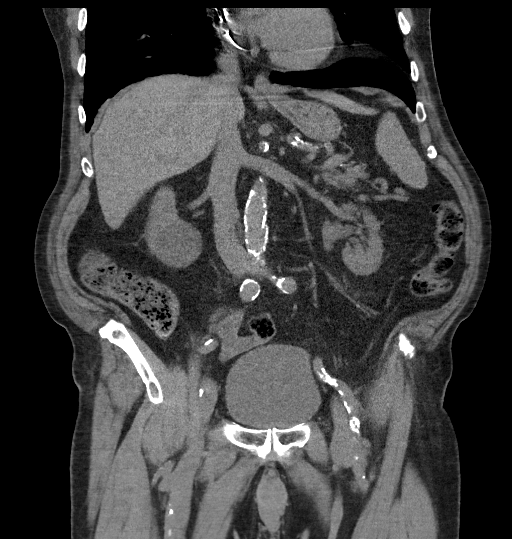
[im 62/123  bone]
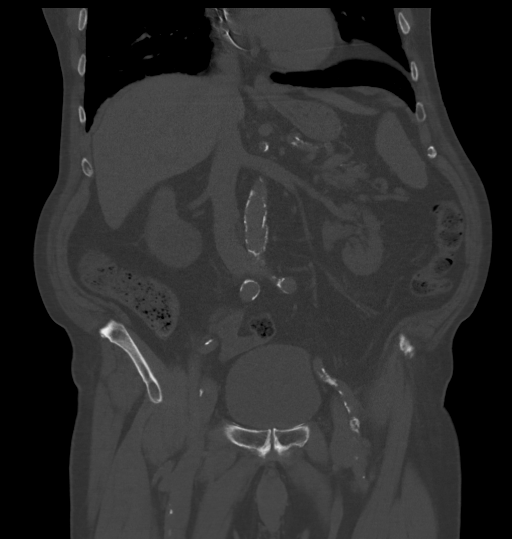
[im 92/123  soft-tissue]
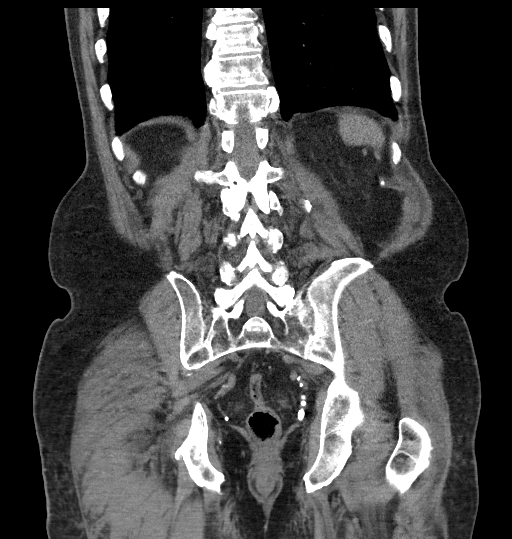

[12 of 46 positions shown; findings below may reference images not displayed]

FINDINGS: Lower chest: There are scattered linear scar-like opacities in the
lung bases. No infiltrate is seen. There is artifact from bipolar
pacemaker wiring in the heart. Small hiatal hernia.

Hepatobiliary: 19 cm in length liver with mild steatosis. No mass
enhancement. Gallbladder and bile ducts are unremarkable.

Pancreas: Normal.

Spleen: Mildly prominent, 13.9 cm in length with uniform
enhancement.

Adrenals/Urinary Tract: There is no adrenal mass. There is a
nonenhancing 5.6 cm cyst in the inferior pole right kidney and a
cm nonenhancing parapelvic cyst posteriorly in the left kidney.
There is no renal mass enhancement, stone or hydronephrosis. There
is no thickening of the bladder.

Stomach/Bowel: No dilatation or wall thickening including the
appendix. Moderate stool retention. Colonic diverticula without
evidence of diverticulitis.

Vascular/Lymphatic: Aortic atherosclerosis. No enlarged abdominal or
pelvic lymph nodes. Prominent portal vein measuring 1.7 cm.

Reproductive: The prostate is enlarged measuring 5.2 cm transversely
with brachytherapy seeds and slight bladder base impression. Seminal
vesicles are unremarkable.

Other: Small umbilical and inguinal fat hernias. There is no free
air, hemorrhage or fluid.

Musculoskeletal: There are advanced degenerative changes of lumbar
spine with multilevel foraminal encroachment. There is multilevel
anterior bridging enthesopathy of the visualized lower thoracic
spine of DISH. There is bilateral mild hip DJD and ankylosis across
both SI joints. No suspicious regional bone lesion is seen.
IMPRESSION: 1. Prostatic prominence with evidence of prior brachytherapy, slight
bladder base impression without bladder thickening.
2. Mildly prominent liver and spleen with mild hepatic steatosis.
3. Nonenhancing renal cysts.
4. Small hiatal hernia and small umbilical and inguinal fat hernias.
5. Diverticulosis and constipation.
6. Prominent portal vein 1.7 cm.
7. Degenerative changes with no worrisome regional bone lesions.
8. Heavy aortoiliac calcific plaques.

## 2021-12-23 DIAGNOSIS — I4891 Unspecified atrial fibrillation: Secondary | ICD-10-CM | POA: Diagnosis not present

## 2021-12-23 DIAGNOSIS — Z45018 Encounter for adjustment and management of other part of cardiac pacemaker: Secondary | ICD-10-CM | POA: Diagnosis not present

## 2021-12-30 ENCOUNTER — Other Ambulatory Visit: Payer: Medicare Other

## 2021-12-30 DIAGNOSIS — C61 Malignant neoplasm of prostate: Secondary | ICD-10-CM

## 2021-12-31 LAB — PSA: Prostate Specific Ag, Serum: 10.8 ng/mL — ABNORMAL HIGH (ref 0.0–4.0)

## 2022-01-03 DIAGNOSIS — M1711 Unilateral primary osteoarthritis, right knee: Secondary | ICD-10-CM | POA: Diagnosis not present

## 2022-01-05 ENCOUNTER — Encounter: Payer: Self-pay | Admitting: Urology

## 2022-01-05 ENCOUNTER — Ambulatory Visit: Payer: Medicare Other | Admitting: Urology

## 2022-01-05 DIAGNOSIS — N401 Enlarged prostate with lower urinary tract symptoms: Secondary | ICD-10-CM

## 2022-01-05 DIAGNOSIS — R339 Retention of urine, unspecified: Secondary | ICD-10-CM | POA: Diagnosis not present

## 2022-01-05 DIAGNOSIS — N138 Other obstructive and reflux uropathy: Secondary | ICD-10-CM | POA: Diagnosis not present

## 2022-01-05 DIAGNOSIS — C61 Malignant neoplasm of prostate: Secondary | ICD-10-CM | POA: Diagnosis not present

## 2022-01-05 LAB — BLADDER SCAN AMB NON-IMAGING: Scan Result: 558

## 2022-01-05 MED ORDER — TAMSULOSIN HCL 0.4 MG PO CAPS: 0.4000 mg | ORAL_CAPSULE | Freq: Two times a day (BID) | ORAL | 11 refills | Status: DC

## 2022-01-05 NOTE — Patient Instructions (Signed)

## 2022-01-05 NOTE — Progress Notes (Signed)
01/05/2022 3:12 PM   Odessa Fleming 1940-11-16 [id number]  Referring provider: Manon Hilding, MD [place],  [place] [address]  Followup BPH and incomplete emptying   HPI: Mr Paul Clay is a 81yo here for followup for BPh with incomplete emptying. PVR 350cc. He is on flomax 0.'4mg'$  BID. IPSS 12 QOL 3. Urine stream fair. No straining to urinate. Nocturia 1-2x. PSA increased to 10.8 from 9.0.   PMH: Past Medical History:  Diagnosis Date   Cancer ([place])    Diabetes mellitus without complication ([place])     Surgical History: No past surgical history on file.  Home Medications:  Allergies as of 01/05/2022   No Known Allergies      Medication List        Accurate as of January 05, 2022  3:12 PM. If you have any questions, ask your nurse or doctor.          clotrimazole-betamethasone cream Commonly known as: LOTRISONE APPLY AS DIRECTED TWICE DAILY.   cromolyn 4 % ophthalmic solution Commonly known as: OPTICROM 1 drop 2 (two) times daily.   empagliflozin 25 MG Tabs tablet Commonly known as: JARDIANCE Take 25 mg by mouth daily.   glipiZIDE 10 MG 24 hr tablet Commonly known as: GLUCOTROL XL Take 10 mg by mouth daily with breakfast.   ibuprofen 200 MG tablet Commonly known as: ADVIL Take by mouth.   lisinopril 5 MG tablet Commonly known as: ZESTRIL Take 5 mg by mouth daily.   metFORMIN 500 MG 24 hr tablet Commonly known as: GLUCOPHAGE-XR Take 2,000 mg by mouth daily with breakfast.   OneTouch Ultra test strip Generic drug: glucose blood 2 (two) times daily.   tamsulosin 0.4 MG Caps capsule Commonly known as: FLOMAX Take 1 capsule (0.4 mg total) by mouth in the morning and at bedtime.   VITAMIN B-COMPLEX PO Take 1 tablet by mouth daily.        Allergies: No Known Allergies  Family History: No family history on file.  Social History:  reports that he quit smoking about 23 years ago. His smoking use included cigarettes. He has never used  smokeless tobacco. He reports that he does not drink alcohol and does not use drugs.  ROS: All other review of systems were reviewed and are negative except what is noted above in HPI  Physical Exam: There were no vitals taken for this visit.  Constitutional:  Alert and oriented, No acute distress. HEENT: [place] AT, moist mucus membranes.  Trachea midline, no masses. Cardiovascular: No clubbing, cyanosis, or edema. Respiratory: Normal respiratory effort, no increased work of breathing. GI: Abdomen is soft, nontender, nondistended, no abdominal masses GU: No CVA tenderness.  Lymph: No cervical or inguinal lymphadenopathy. Skin: No rashes, bruises or suspicious lesions. Neurologic: Grossly intact, no focal deficits, moving all 4 extremities. Psychiatric: Normal mood and affect.  Laboratory Data: No results found for: "WBC", "HGB", "HCT", "MCV", "PLT"  Lab Results  Component Value Date   CREATININE 1.61 (H) 12/30/2020    No results found for: "PSA"  No results found for: "TESTOSTERONE"  No results found for: "HGBA1C"  Urinalysis    Component Value Date/Time   APPEARANCEUR Clear 11/29/2021 1612   GLUCOSEU 3+ (A) 11/29/2021 1612   BILIRUBINUR Negative 11/29/2021 1612   PROTEINUR Negative 11/29/2021 1612   NITRITE Negative 11/29/2021 1612   LEUKOCYTESUR Negative 11/29/2021 1612    Lab Results  Component Value Date   LABMICR Comment 11/29/2021    Pertinent  Imaging:  No results found for this or any previous visit.  No results found for this or any previous visit.  No results found for this or any previous visit.  No results found for this or any previous visit.  No results found for this or any previous visit.  No valid procedures specified. No results found for this or any previous visit.  No results found for this or any previous visit.   Assessment & Plan:    1. Incomplete emptying of bladder -continue flomax 0.'4mg'$  BID - BLADDER SCAN AMB NON-IMAGING  2.  Benign prostatic hyperplasia with urinary obstruction Continue floma x0.'4mg'$  BID - Urinalysis, Routine w reflex microscopic  3. Prostate cancer -continue surveillance. RTC 3 months with PSA   No follow-ups on file.  Nicolette Bang, MD  [facility] [place]

## 2022-01-05 NOTE — Progress Notes (Signed)
post void residual=558

## 2022-01-06 DIAGNOSIS — I10 Essential (primary) hypertension: Secondary | ICD-10-CM | POA: Diagnosis not present

## 2022-01-06 DIAGNOSIS — E1165 Type 2 diabetes mellitus with hyperglycemia: Secondary | ICD-10-CM | POA: Diagnosis not present

## 2022-01-06 DIAGNOSIS — Z13220 Encounter for screening for lipoid disorders: Secondary | ICD-10-CM | POA: Diagnosis not present

## 2022-01-06 DIAGNOSIS — R197 Diarrhea, unspecified: Secondary | ICD-10-CM | POA: Diagnosis not present

## 2022-01-06 DIAGNOSIS — R739 Hyperglycemia, unspecified: Secondary | ICD-10-CM | POA: Diagnosis not present

## 2022-01-06 LAB — URINALYSIS, ROUTINE W REFLEX MICROSCOPIC
Bilirubin, UA: NEGATIVE
Ketones, UA: NEGATIVE
Leukocytes,UA: NEGATIVE
Nitrite, UA: NEGATIVE
Protein,UA: NEGATIVE
RBC, UA: NEGATIVE
Specific Gravity, UA: 1.015 (ref 1.005–1.030)
Urobilinogen, Ur: 0.2 mg/dL (ref 0.2–1.0)
pH, UA: 5 (ref 5.0–7.5)

## 2022-01-11 ENCOUNTER — Ambulatory Visit: Payer: Medicare Other | Admitting: Physician Assistant

## 2022-01-31 DIAGNOSIS — Z79899 Other long term (current) drug therapy: Secondary | ICD-10-CM | POA: Diagnosis not present

## 2022-01-31 DIAGNOSIS — Z950 Presence of cardiac pacemaker: Secondary | ICD-10-CM | POA: Diagnosis not present

## 2022-01-31 DIAGNOSIS — Z7984 Long term (current) use of oral hypoglycemic drugs: Secondary | ICD-10-CM | POA: Diagnosis not present

## 2022-01-31 DIAGNOSIS — I4729 Other ventricular tachycardia: Secondary | ICD-10-CM | POA: Diagnosis not present

## 2022-01-31 DIAGNOSIS — M1990 Unspecified osteoarthritis, unspecified site: Secondary | ICD-10-CM | POA: Diagnosis not present

## 2022-01-31 DIAGNOSIS — Z87891 Personal history of nicotine dependence: Secondary | ICD-10-CM | POA: Diagnosis not present

## 2022-01-31 DIAGNOSIS — I2510 Atherosclerotic heart disease of native coronary artery without angina pectoris: Secondary | ICD-10-CM | POA: Diagnosis not present

## 2022-01-31 DIAGNOSIS — I471 Supraventricular tachycardia, unspecified: Secondary | ICD-10-CM | POA: Diagnosis not present

## 2022-01-31 DIAGNOSIS — Z45018 Encounter for adjustment and management of other part of cardiac pacemaker: Secondary | ICD-10-CM | POA: Diagnosis not present

## 2022-01-31 DIAGNOSIS — E119 Type 2 diabetes mellitus without complications: Secondary | ICD-10-CM | POA: Diagnosis not present

## 2022-01-31 DIAGNOSIS — I442 Atrioventricular block, complete: Secondary | ICD-10-CM | POA: Diagnosis not present

## 2022-01-31 DIAGNOSIS — I10 Essential (primary) hypertension: Secondary | ICD-10-CM | POA: Diagnosis not present

## 2022-03-24 DIAGNOSIS — Z950 Presence of cardiac pacemaker: Secondary | ICD-10-CM | POA: Diagnosis not present

## 2022-03-24 DIAGNOSIS — Z45018 Encounter for adjustment and management of other part of cardiac pacemaker: Secondary | ICD-10-CM | POA: Diagnosis not present

## 2022-04-06 ENCOUNTER — Other Ambulatory Visit: Payer: Medicare Other

## 2022-04-06 ENCOUNTER — Other Ambulatory Visit: Payer: Self-pay

## 2022-04-06 DIAGNOSIS — C61 Malignant neoplasm of prostate: Secondary | ICD-10-CM

## 2022-04-07 LAB — PSA, TOTAL AND FREE
PSA, Free Pct: 56.6 %
PSA, Free: 9.8 ng/mL
Prostate Specific Ag, Serum: 17.3 ng/mL — ABNORMAL HIGH (ref 0.0–4.0)

## 2022-04-08 ENCOUNTER — Encounter: Payer: Self-pay | Admitting: Urology

## 2022-04-08 ENCOUNTER — Ambulatory Visit: Payer: Medicare Other | Admitting: Urology

## 2022-04-08 VITALS — BP 148/75 | HR 73

## 2022-04-08 DIAGNOSIS — N401 Enlarged prostate with lower urinary tract symptoms: Secondary | ICD-10-CM

## 2022-04-08 DIAGNOSIS — C61 Malignant neoplasm of prostate: Secondary | ICD-10-CM | POA: Diagnosis not present

## 2022-04-08 DIAGNOSIS — R339 Retention of urine, unspecified: Secondary | ICD-10-CM | POA: Diagnosis not present

## 2022-04-08 DIAGNOSIS — N138 Other obstructive and reflux uropathy: Secondary | ICD-10-CM

## 2022-04-08 LAB — BLADDER SCAN AMB NON-IMAGING: Scan Result: 229

## 2022-04-08 MED ORDER — TAMSULOSIN HCL 0.4 MG PO CAPS: 0.4000 mg | ORAL_CAPSULE | Freq: Two times a day (BID) | ORAL | 11 refills | Status: AC

## 2022-04-08 NOTE — Patient Instructions (Signed)

## 2022-04-08 NOTE — Progress Notes (Signed)
post void residual=229

## 2022-04-08 NOTE — Progress Notes (Signed)
04/08/2022 1:09 PM   Paul Clay [birth year] [id number]  Referring provider: Manon Hilding, MD [place],  [place] [address]  Followup prostate cancer and BPH   HPI: [name] is a 82yo here for followup for prostate cancer and BPH. PSA increased to 17.3 from 10.8. No worsening bone pain. IPSS PVR 220cc. IPSS 13 QOL 3 on flomax 0.'4mg'$  BID. Urine stream fair. Nocturia 1-3x. No urinary hesitancy.    PMH: Past Medical History:  Diagnosis Date   Cancer ([place])    Diabetes mellitus without complication ([place])     Surgical History: No past surgical history on file.  Home Medications:  Allergies as of 04/08/2022   No Known Allergies      Medication List        Accurate as of April 08, 2022  1:09 PM. If you have any questions, ask your nurse or doctor.          clotrimazole-betamethasone cream Commonly known as: LOTRISONE APPLY AS DIRECTED TWICE DAILY.   cromolyn 4 % ophthalmic solution Commonly known as: OPTICROM 1 drop 2 (two) times daily.   empagliflozin 25 MG Tabs tablet Commonly known as: JARDIANCE Take 25 mg by mouth daily.   glipiZIDE 10 MG 24 hr tablet Commonly known as: GLUCOTROL XL Take 10 mg by mouth daily with breakfast.   ibuprofen 200 MG tablet Commonly known as: ADVIL Take by mouth.   lisinopril 5 MG tablet Commonly known as: ZESTRIL Take 5 mg by mouth daily.   metFORMIN 500 MG 24 hr tablet Commonly known as: GLUCOPHAGE-XR Take 2,000 mg by mouth daily with breakfast.   OneTouch Ultra test strip Generic drug: glucose blood 2 (two) times daily.   Ozempic (0.25 or 0.5 MG/DOSE) 2 MG/3ML Sopn Generic drug: Semaglutide(0.25 or 0.'5MG'$ /DOS) Inject 0.5 mg into the skin once a week.   tamsulosin 0.4 MG Caps capsule Commonly known as: FLOMAX Take 1 capsule (0.4 mg total) by mouth in the morning and at bedtime.   VITAMIN B-COMPLEX PO Take 1 tablet by mouth daily.        Allergies: No Known Allergies  Family History: No  family history on file.  Social History:  reports that he quit smoking about 24 years ago. His smoking use included cigarettes. He has never used smokeless tobacco. He reports that he does not drink alcohol and does not use drugs.  ROS: All other review of systems were reviewed and are negative except what is noted above in HPI  Physical Exam: BP (!) 148/75   Pulse 73   Constitutional:  Alert and oriented, No acute distress. HEENT:  AT, moist mucus membranes.  Trachea midline, no masses. Cardiovascular: No clubbing, cyanosis, or edema. Respiratory: Normal respiratory effort, no increased work of breathing. GI: Abdomen is soft, nontender, nondistended, no abdominal masses GU: No CVA tenderness.  Lymph: No cervical or inguinal lymphadenopathy. Skin: No rashes, bruises or suspicious lesions. Neurologic: Grossly intact, no focal deficits, moving all 4 extremities. Psychiatric: Normal mood and affect.  Laboratory Data: No results found for: "WBC", "HGB", "HCT", "MCV", "PLT"  Lab Results  Component Value Date   CREATININE 1.61 (H) 12/30/2020    No results found for: "PSA"  No results found for: "TESTOSTERONE"  No results found for: "HGBA1C"  Urinalysis    Component Value Date/Time   APPEARANCEUR Clear 01/05/2022 1512   GLUCOSEU 3+ (A) 01/05/2022 1512   BILIRUBINUR Negative 01/05/2022 1512   PROTEINUR Negative 01/05/2022 1512   NITRITE  Negative 01/05/2022 1512   LEUKOCYTESUR Negative 01/05/2022 1512    Lab Results  Component Value Date   LABMICR Comment 11/29/2021    Pertinent Imaging:  No results found for this or any previous visit.  No results found for this or any previous visit.  No results found for this or any previous visit.  No results found for this or any previous visit.  No results found for this or any previous visit.  No valid procedures specified. No results found for this or any previous visit.  No results found for this or any previous  visit.   Assessment & Plan:    1. Prostate cancer ([place]) -PSMA PET - Urinalysis, Routine w reflex microscopic  2. Incomplete emptying of bladder -continue flomax 0.'4mg'$   BID - BLADDER SCAN AMB NON-IMAGING  3. Benign prostatic hyperplasia with urinary obstruction -continue flomax 0.'4mg'$  BID   No follow-ups on file.  Nicolette Bang, MD  [facility] [place]

## 2022-04-13 ENCOUNTER — Ambulatory Visit: Payer: Medicare Other | Admitting: Urology

## 2022-04-13 DIAGNOSIS — E1151 Type 2 diabetes mellitus with diabetic peripheral angiopathy without gangrene: Secondary | ICD-10-CM | POA: Diagnosis not present

## 2022-04-13 DIAGNOSIS — L110 Acquired keratosis follicularis: Secondary | ICD-10-CM | POA: Diagnosis not present

## 2022-04-13 DIAGNOSIS — M79671 Pain in right foot: Secondary | ICD-10-CM | POA: Diagnosis not present

## 2022-04-13 DIAGNOSIS — E1140 Type 2 diabetes mellitus with diabetic neuropathy, unspecified: Secondary | ICD-10-CM | POA: Diagnosis not present

## 2022-04-13 DIAGNOSIS — M79675 Pain in left toe(s): Secondary | ICD-10-CM | POA: Diagnosis not present

## 2022-04-13 DIAGNOSIS — M79674 Pain in right toe(s): Secondary | ICD-10-CM | POA: Diagnosis not present

## 2022-04-13 DIAGNOSIS — M79672 Pain in left foot: Secondary | ICD-10-CM | POA: Diagnosis not present

## 2022-04-19 DIAGNOSIS — G4733 Obstructive sleep apnea (adult) (pediatric): Secondary | ICD-10-CM | POA: Diagnosis not present

## 2022-05-05 ENCOUNTER — Ambulatory Visit (HOSPITAL_COMMUNITY)
Admission: RE | Admit: 2022-05-05 | Discharge: 2022-05-05 | Disposition: A | Discharge status: Home / Self Care | Payer: Medicare Other | Source: Ambulatory Visit | Attending: Urology | Admitting: Urology

## 2022-05-05 DIAGNOSIS — C61 Malignant neoplasm of prostate: Secondary | ICD-10-CM | POA: Diagnosis present

## 2022-05-05 MED ORDER — PIFLIFOLASTAT F 18 (PYLARIFY) INJECTION
9.0000 | Freq: Once | INTRAVENOUS | Status: AC
  Administered 2022-05-05: 9.1 via INTRAVENOUS

## 2022-05-11 DIAGNOSIS — C7951 Secondary malignant neoplasm of bone: Secondary | ICD-10-CM | POA: Diagnosis not present

## 2022-05-12 DIAGNOSIS — C7951 Secondary malignant neoplasm of bone: Secondary | ICD-10-CM | POA: Diagnosis not present

## 2022-05-17 ENCOUNTER — Ambulatory Visit: Payer: Medicare Other | Admitting: Urology

## 2022-05-18 DIAGNOSIS — G4733 Obstructive sleep apnea (adult) (pediatric): Secondary | ICD-10-CM | POA: Diagnosis not present

## 2022-05-24 ENCOUNTER — Ambulatory Visit: Payer: Medicare Other | Admitting: Urology

## 2022-05-24 ENCOUNTER — Encounter: Payer: Self-pay | Admitting: Urology

## 2022-05-24 ENCOUNTER — Telehealth: Payer: Self-pay

## 2022-05-24 VITALS — BP 122/63 | HR 66

## 2022-05-24 DIAGNOSIS — R339 Retention of urine, unspecified: Secondary | ICD-10-CM

## 2022-05-24 DIAGNOSIS — N401 Enlarged prostate with lower urinary tract symptoms: Secondary | ICD-10-CM | POA: Diagnosis not present

## 2022-05-24 DIAGNOSIS — C61 Malignant neoplasm of prostate: Secondary | ICD-10-CM

## 2022-05-24 DIAGNOSIS — N138 Other obstructive and reflux uropathy: Secondary | ICD-10-CM | POA: Diagnosis not present

## 2022-05-24 NOTE — Telephone Encounter (Signed)
FYI

## 2022-05-24 NOTE — Telephone Encounter (Signed)
Nurse from care facility called to let office know patient has been approved for [place]. Will be given to patient at facility. Do not administer at office.

## 2022-05-24 NOTE — Patient Instructions (Signed)

## 2022-05-24 NOTE — Progress Notes (Signed)
05/24/2022 2:41 PM   Paul Clay 1941-02-08 [id number]  Referring provider: Manon Hilding, MD [address],  [place] [id number]  Followup prostate cancer   HPI: Paul Clay is a 82yo here for followup for prostate cancer. PSMA PET showed T12 metastatic lesion. He is scheduled to start xtandi after starting firmagon and eligard. He denies nay significant bone pain. He denies any worsening LUTS. IPSS 11 QOL 2 on flomax BID.  No feeling of incomplete emptying. Nocturia 1-2x. Urine stream strong.    PMH: Past Medical History:  Diagnosis Date   Cancer ([place])    Diabetes mellitus without complication ([place])     Surgical History: No past surgical history on file.  Home Medications:  Allergies as of 05/24/2022   No Known Allergies      Medication List        Accurate as of May 24, 2022  2:41 PM. If you have any questions, ask your nurse or doctor.          clotrimazole-betamethasone cream Commonly known as: LOTRISONE APPLY AS DIRECTED TWICE DAILY.   cromolyn 4 % ophthalmic solution Commonly known as: OPTICROM 1 drop 2 (two) times daily.   empagliflozin 25 MG Tabs tablet Commonly known as: JARDIANCE Take 25 mg by mouth daily.   glipiZIDE 10 MG 24 hr tablet Commonly known as: GLUCOTROL XL Take 10 mg by mouth daily with breakfast.   ibuprofen 200 MG tablet Commonly known as: ADVIL Take by mouth.   lisinopril 5 MG tablet Commonly known as: ZESTRIL Take 5 mg by mouth daily.   metFORMIN 500 MG 24 hr tablet Commonly known as: GLUCOPHAGE-XR Take 2,000 mg by mouth daily with breakfast.   OneTouch Ultra test strip Generic drug: glucose blood 2 (two) times daily.   Ozempic (0.25 or 0.5 MG/DOSE) 2 MG/3ML Sopn Generic drug: Semaglutide(0.25 or 0.5MG /DOS) Inject 0.5 mg into the skin once a week.   tamsulosin 0.4 MG Caps capsule Commonly known as: FLOMAX Take 1 capsule (0.4 mg total) by mouth in the morning and at bedtime.   VITAMIN B-COMPLEX PO Take 1  tablet by mouth daily.        Allergies: No Known Allergies  Family History: No family history on file.  Social History:  reports that he quit smoking about 24 years ago. His smoking use included cigarettes. He has never used smokeless tobacco. He reports that he does not drink alcohol and does not use drugs.  ROS: All other review of systems were reviewed and are negative except what is noted above in HPI  Physical Exam: BP 122/63   Pulse 66   Constitutional:  Alert and oriented, No acute distress. HEENT: [place] AT, moist mucus membranes.  Trachea midline, no masses. Cardiovascular: No clubbing, cyanosis, or edema. Respiratory: Normal respiratory effort, no increased work of breathing. GI: Abdomen is soft, nontender, nondistended, no abdominal masses GU: No CVA tenderness.  Lymph: No cervical or inguinal lymphadenopathy. Skin: No rashes, bruises or suspicious lesions. Neurologic: Grossly intact, no focal deficits, moving all 4 extremities. Psychiatric: Normal mood and affect.  Laboratory Data: No results found for: "WBC", "HGB", "HCT", "MCV", "PLT"  Lab Results  Component Value Date   CREATININE 1.61 (H) 12/30/2020    No results found for: "PSA"  No results found for: "TESTOSTERONE"  No results found for: "HGBA1C"  Urinalysis    Component Value Date/Time   APPEARANCEUR Clear 01/05/2022 1512   GLUCOSEU 3+ (A) 01/05/2022 1512   BILIRUBINUR  Negative 01/05/2022 1512   PROTEINUR Negative 01/05/2022 1512   NITRITE Negative 01/05/2022 1512   LEUKOCYTESUR Negative 01/05/2022 1512    Lab Results  Component Value Date   LABMICR Comment 11/29/2021    Pertinent Imaging: PSMA PETY: Images reviewed and discussed with the patient  No results found for this or any previous visit.  No results found for this or any previous visit.  No results found for this or any previous visit.  No results found for this or any previous visit.  No results found for this or any  previous visit.  No valid procedures specified. No results found for this or any previous visit.  No results found for this or any previous visit.   Assessment & Plan:    1. Prostate cancer ([place]) -Firmagon to be initiated followed by eligard. Followup in 1 month with PSA - Urinalysis, Routine w reflex microscopic  2. Incomplete emptying of bladder Continue flomax BID  3. Benign prostatic hyperplasia with urinary obstruction Continue flomax BID   No follow-ups on file.  Nicolette Bang, MD  [facility] [place]

## 2022-05-25 ENCOUNTER — Ambulatory Visit: Payer: Medicare Other

## 2022-05-25 NOTE — Telephone Encounter (Signed)
Per Dr. Alyson Ingles patient will receive injections here at the office. Dr. Alyson Ingles made oncology aware.

## 2022-05-26 ENCOUNTER — Ambulatory Visit (INDEPENDENT_AMBULATORY_CARE_PROVIDER_SITE_OTHER): Payer: Medicare Other | Admitting: Urology

## 2022-05-26 ENCOUNTER — Ambulatory Visit: Payer: Medicare Other

## 2022-05-26 DIAGNOSIS — E1122 Type 2 diabetes mellitus with diabetic chronic kidney disease: Secondary | ICD-10-CM | POA: Diagnosis not present

## 2022-05-26 DIAGNOSIS — R739 Hyperglycemia, unspecified: Secondary | ICD-10-CM | POA: Diagnosis not present

## 2022-05-26 DIAGNOSIS — C61 Malignant neoplasm of prostate: Secondary | ICD-10-CM | POA: Diagnosis not present

## 2022-05-26 DIAGNOSIS — Z13220 Encounter for screening for lipoid disorders: Secondary | ICD-10-CM | POA: Diagnosis not present

## 2022-05-26 MED ORDER — DEGARELIX ACETATE(240 MG DOSE) 120 MG/VIAL ~~LOC~~ SOLR
240.0000 mg | Freq: Once | SUBCUTANEOUS | Status: AC
  Administered 2022-05-26: 240 mg via SUBCUTANEOUS

## 2022-05-26 NOTE — Progress Notes (Signed)
Firmagon Sub Q Injection  Due to Prostate Cancer patient is present today for a Firmagon Injection.   Medication: Mills Koller (Degarelix)  Dose: 240mg  Location: right & left upper abdomen Lot: [id number] Exp: 09/05/2023  Patient tolerated well, no complications were noted  Performed by: Latresa Gasser LPN  Follow up: Keep scheduled OV

## 2022-05-31 ENCOUNTER — Ambulatory Visit (INDEPENDENT_AMBULATORY_CARE_PROVIDER_SITE_OTHER): Payer: Medicare Other | Admitting: Urology

## 2022-05-31 DIAGNOSIS — C61 Malignant neoplasm of prostate: Secondary | ICD-10-CM | POA: Diagnosis not present

## 2022-05-31 NOTE — Progress Notes (Signed)
Patient in office today concern about redness and itching at [place] injection sites.  Patient advised that area look fine and current side effects are normal.  [place] education given and reviewed with patient at initial firmagon appointment and reiterated today.  Patient voiced understanding.

## 2022-05-31 NOTE — Patient Instructions (Signed)
Patient Information Sheet : (Denosumab) Xgeva, Prolia)  What is this drug? Denosumab is a drug that maintains the strength of bones. It is FDA- approved treatment for patients with documented bone metastasis from solid tumors such as prostate cancer under the name of xgeva. It is also an FDA-approved treatment for men with prostate cancer at risk for complications related to bone loss under the name Prolia. Denosumab is not chemotherapy but is used with other treatments for advanced prostate cancer. It is administered by a subcutaneous injection.  Why am I taking this drug? You have been diagnosed with prostate cancer and are felt to be at an increased risk for bone loss related to your treatment or you have advanced prostate cancer that has spread to the bone increasing your risk for bone fractures. It is administered either every 6 months or every month depending on your risk factors for bone fracture.  How can this drug benefit me? Denosumab can reduce the risk of bone fractures and delay complications that occur when cancer has spread to the bones.  What should I do while taking Xgeva or Prolia? You need to have regular dental exams. You need to take calcium 1200mg each day as well as vitamin D 1000 IU per day. There are no restrictions on food, beverages or activity while receiving this medication.  What are the possible side effects of this medication? The most common side effects are fatigue, weakness, nausea and lowering of calcium levels. The most serious reaction os this drug is a serious allergic reaction which is rare. Other serious reactions that would require stopping the medication would be abnormally low blood calcium levels or osteonecrosis of the jaw.  Osteonecrosis of the jaw is a rare condition that involves the loss or breakdown of the jaw bow. Signs and symptoms include pain, swelling or infections of the gums, loosening of the tooth, poor healing of gums, numbness or a  feeling of heaviness in the jaw. You must notify your dentist and your urologist if you have any of these symptoms. Notify your urologist if you need any invasive dental treatments. Notify your dentist prior to starting this treatment. You must provide written clearance from your dentist prior to starting this drug and maintain regular dentist appointments throughout your treatment.   What is involved in receiving this drug? It is a subcutaneous injection (given under the skin) You will have a complete blood chemistry panel drawn prior to starting this drug and repeated every 6 months or as ordered by the doctor. This will check your calcium, magnesium and phosphorus levels.  

## 2022-06-02 ENCOUNTER — Telehealth: Payer: Self-pay

## 2022-06-02 DIAGNOSIS — Z0001 Encounter for general adult medical examination with abnormal findings: Secondary | ICD-10-CM | POA: Diagnosis not present

## 2022-06-02 DIAGNOSIS — N1831 Chronic kidney disease, stage 3a: Secondary | ICD-10-CM | POA: Diagnosis not present

## 2022-06-02 DIAGNOSIS — R251 Tremor, unspecified: Secondary | ICD-10-CM | POA: Diagnosis not present

## 2022-06-02 DIAGNOSIS — E1165 Type 2 diabetes mellitus with hyperglycemia: Secondary | ICD-10-CM | POA: Diagnosis not present

## 2022-06-02 DIAGNOSIS — R197 Diarrhea, unspecified: Secondary | ICD-10-CM | POA: Diagnosis not present

## 2022-06-02 DIAGNOSIS — Z23 Encounter for immunization: Secondary | ICD-10-CM | POA: Diagnosis not present

## 2022-06-02 DIAGNOSIS — I10 Essential (primary) hypertension: Secondary | ICD-10-CM | POA: Diagnosis not present

## 2022-06-02 DIAGNOSIS — R739 Hyperglycemia, unspecified: Secondary | ICD-10-CM | POA: Diagnosis not present

## 2022-06-02 NOTE — Telephone Encounter (Signed)
Patient's daughter left vm wanting to follow up on the PA for patients injection.

## 2022-06-08 NOTE — Telephone Encounter (Signed)
Patient called and scheduled for xgeva injection on 4/9

## 2022-06-14 ENCOUNTER — Ambulatory Visit: Payer: Medicare Other

## 2022-06-15 ENCOUNTER — Telehealth: Payer: Self-pay

## 2022-06-15 NOTE — Telephone Encounter (Signed)
OPTIUM... Sam call in today to confirm metastatic bone lesion for prior authorization for xgenva. Sam is aware that there was a bone scan done and it was positive and sam voiced understanding.

## 2022-06-18 DIAGNOSIS — G4733 Obstructive sleep apnea (adult) (pediatric): Secondary | ICD-10-CM | POA: Diagnosis not present

## 2022-06-22 DIAGNOSIS — E1140 Type 2 diabetes mellitus with diabetic neuropathy, unspecified: Secondary | ICD-10-CM | POA: Diagnosis not present

## 2022-06-22 DIAGNOSIS — M79675 Pain in left toe(s): Secondary | ICD-10-CM | POA: Diagnosis not present

## 2022-06-22 DIAGNOSIS — L110 Acquired keratosis follicularis: Secondary | ICD-10-CM | POA: Diagnosis not present

## 2022-06-22 DIAGNOSIS — M79672 Pain in left foot: Secondary | ICD-10-CM | POA: Diagnosis not present

## 2022-06-22 DIAGNOSIS — M79671 Pain in right foot: Secondary | ICD-10-CM | POA: Diagnosis not present

## 2022-06-22 DIAGNOSIS — M79674 Pain in right toe(s): Secondary | ICD-10-CM | POA: Diagnosis not present

## 2022-06-23 DIAGNOSIS — Z45018 Encounter for adjustment and management of other part of cardiac pacemaker: Secondary | ICD-10-CM | POA: Diagnosis not present

## 2022-06-23 DIAGNOSIS — Z950 Presence of cardiac pacemaker: Secondary | ICD-10-CM | POA: Diagnosis not present

## 2022-06-23 DIAGNOSIS — M1711 Unilateral primary osteoarthritis, right knee: Secondary | ICD-10-CM | POA: Diagnosis not present

## 2022-06-24 ENCOUNTER — Telehealth: Payer: Self-pay

## 2022-06-24 NOTE — Telephone Encounter (Signed)
Patient's daughter called advising that the patient has been recently diagnosed with parkinson's disease and advised he wished to only go to one provider for his cancer treatments. Patient;a daughter advised that he wants to start getting injections at [facility]. I have canceled all upcoming appointments and she advised [facility] will be obtaining the required PA for the medication.

## 2022-06-24 NOTE — Telephone Encounter (Signed)
FYI See below 

## 2022-07-06 ENCOUNTER — Ambulatory Visit: Payer: Medicare Other | Admitting: Urology

## 2022-07-06 DIAGNOSIS — C7951 Secondary malignant neoplasm of bone: Secondary | ICD-10-CM | POA: Diagnosis not present

## 2022-07-06 NOTE — Telephone Encounter (Signed)
Received call from [place], nurse from [facility] . Gearldine Bienenstock explain that the patient is in there office seeing Dr. Fiji for his injections and want to know what Dr. Ronne Binning plans were for ongoing treatment.  Made Brandy nurse aware that patient's daughter request that  patient's care be transfer to [facility]. Made Brandy aware that Dr. Ronne Binning  would have review patient's PSA labs and reaction to first Firmagon inject 240 mg and came up with an initial plan but care was transfer before a plan could be initial. Dr. Fiji would like for Dr. Ronne Binning to follow up with his initial plan [phone]. Gearldine Bienenstock is aware a message will be sent to Dr. Ronne Binning for review and someone will reach back out. Brandy voiced understanding

## 2022-07-14 ENCOUNTER — Ambulatory Visit: Payer: Medicare Other

## 2022-07-14 DIAGNOSIS — C7951 Secondary malignant neoplasm of bone: Secondary | ICD-10-CM | POA: Diagnosis not present

## 2022-07-18 DIAGNOSIS — G4733 Obstructive sleep apnea (adult) (pediatric): Secondary | ICD-10-CM | POA: Diagnosis not present

## 2022-08-08 DIAGNOSIS — C7951 Secondary malignant neoplasm of bone: Secondary | ICD-10-CM | POA: Diagnosis not present

## 2022-08-09 DIAGNOSIS — C7951 Secondary malignant neoplasm of bone: Secondary | ICD-10-CM | POA: Diagnosis not present

## 2022-08-18 DIAGNOSIS — G4733 Obstructive sleep apnea (adult) (pediatric): Secondary | ICD-10-CM | POA: Diagnosis not present

## 2022-09-06 DIAGNOSIS — C7951 Secondary malignant neoplasm of bone: Secondary | ICD-10-CM | POA: Diagnosis not present

## 2022-09-14 DIAGNOSIS — M79674 Pain in right toe(s): Secondary | ICD-10-CM | POA: Diagnosis not present

## 2022-09-14 DIAGNOSIS — M79675 Pain in left toe(s): Secondary | ICD-10-CM | POA: Diagnosis not present

## 2022-09-14 DIAGNOSIS — L110 Acquired keratosis follicularis: Secondary | ICD-10-CM | POA: Diagnosis not present

## 2022-09-14 DIAGNOSIS — E1140 Type 2 diabetes mellitus with diabetic neuropathy, unspecified: Secondary | ICD-10-CM | POA: Diagnosis not present

## 2022-09-14 DIAGNOSIS — I739 Peripheral vascular disease, unspecified: Secondary | ICD-10-CM | POA: Diagnosis not present

## 2022-09-14 DIAGNOSIS — M79672 Pain in left foot: Secondary | ICD-10-CM | POA: Diagnosis not present

## 2022-09-14 DIAGNOSIS — M79671 Pain in right foot: Secondary | ICD-10-CM | POA: Diagnosis not present

## 2022-09-17 DIAGNOSIS — G4733 Obstructive sleep apnea (adult) (pediatric): Secondary | ICD-10-CM | POA: Diagnosis not present

## 2022-10-11 DIAGNOSIS — C7951 Secondary malignant neoplasm of bone: Secondary | ICD-10-CM | POA: Diagnosis not present

## 2022-10-11 DIAGNOSIS — I700 Atherosclerosis of aorta: Secondary | ICD-10-CM | POA: Diagnosis not present

## 2022-10-11 DIAGNOSIS — Z7984 Long term (current) use of oral hypoglycemic drugs: Secondary | ICD-10-CM | POA: Diagnosis not present

## 2022-10-11 DIAGNOSIS — Z79899 Other long term (current) drug therapy: Secondary | ICD-10-CM | POA: Diagnosis not present

## 2022-10-11 DIAGNOSIS — J439 Emphysema, unspecified: Secondary | ICD-10-CM | POA: Diagnosis not present

## 2022-10-25 DIAGNOSIS — R739 Hyperglycemia, unspecified: Secondary | ICD-10-CM | POA: Diagnosis not present

## 2022-10-25 DIAGNOSIS — E7849 Other hyperlipidemia: Secondary | ICD-10-CM | POA: Diagnosis not present

## 2022-10-25 DIAGNOSIS — N1831 Chronic kidney disease, stage 3a: Secondary | ICD-10-CM | POA: Diagnosis not present

## 2022-10-25 DIAGNOSIS — I10 Essential (primary) hypertension: Secondary | ICD-10-CM | POA: Diagnosis not present

## 2022-11-01 DIAGNOSIS — R739 Hyperglycemia, unspecified: Secondary | ICD-10-CM | POA: Diagnosis not present

## 2022-11-01 DIAGNOSIS — E1165 Type 2 diabetes mellitus with hyperglycemia: Secondary | ICD-10-CM | POA: Diagnosis not present

## 2022-11-01 DIAGNOSIS — R197 Diarrhea, unspecified: Secondary | ICD-10-CM | POA: Diagnosis not present

## 2022-11-01 DIAGNOSIS — I10 Essential (primary) hypertension: Secondary | ICD-10-CM | POA: Diagnosis not present

## 2022-11-01 DIAGNOSIS — C7951 Secondary malignant neoplasm of bone: Secondary | ICD-10-CM | POA: Diagnosis not present

## 2022-11-01 DIAGNOSIS — N1831 Chronic kidney disease, stage 3a: Secondary | ICD-10-CM | POA: Diagnosis not present

## 2022-11-01 DIAGNOSIS — R251 Tremor, unspecified: Secondary | ICD-10-CM | POA: Diagnosis not present

## 2022-11-23 DIAGNOSIS — M1711 Unilateral primary osteoarthritis, right knee: Secondary | ICD-10-CM | POA: Diagnosis not present

## 2022-11-23 DIAGNOSIS — M79671 Pain in right foot: Secondary | ICD-10-CM | POA: Diagnosis not present

## 2022-11-23 DIAGNOSIS — M79674 Pain in right toe(s): Secondary | ICD-10-CM | POA: Diagnosis not present

## 2022-11-23 DIAGNOSIS — E1140 Type 2 diabetes mellitus with diabetic neuropathy, unspecified: Secondary | ICD-10-CM | POA: Diagnosis not present

## 2022-11-23 DIAGNOSIS — L110 Acquired keratosis follicularis: Secondary | ICD-10-CM | POA: Diagnosis not present

## 2022-11-23 DIAGNOSIS — M79675 Pain in left toe(s): Secondary | ICD-10-CM | POA: Diagnosis not present

## 2022-11-23 DIAGNOSIS — M79672 Pain in left foot: Secondary | ICD-10-CM | POA: Diagnosis not present

## 2022-11-23 DIAGNOSIS — E1151 Type 2 diabetes mellitus with diabetic peripheral angiopathy without gangrene: Secondary | ICD-10-CM | POA: Diagnosis not present

## 2022-12-22 DIAGNOSIS — I442 Atrioventricular block, complete: Secondary | ICD-10-CM | POA: Diagnosis not present

## 2022-12-22 DIAGNOSIS — Z45018 Encounter for adjustment and management of other part of cardiac pacemaker: Secondary | ICD-10-CM | POA: Diagnosis not present

## 2022-12-22 DIAGNOSIS — Z950 Presence of cardiac pacemaker: Secondary | ICD-10-CM | POA: Diagnosis not present

## 2023-01-18 DIAGNOSIS — Z5111 Encounter for antineoplastic chemotherapy: Secondary | ICD-10-CM | POA: Diagnosis not present

## 2023-01-18 DIAGNOSIS — Z923 Personal history of irradiation: Secondary | ICD-10-CM | POA: Diagnosis not present

## 2023-01-18 DIAGNOSIS — C7951 Secondary malignant neoplasm of bone: Secondary | ICD-10-CM | POA: Diagnosis not present

## 2023-01-23 DIAGNOSIS — Z950 Presence of cardiac pacemaker: Secondary | ICD-10-CM | POA: Diagnosis not present

## 2023-01-23 DIAGNOSIS — Z45018 Encounter for adjustment and management of other part of cardiac pacemaker: Secondary | ICD-10-CM | POA: Diagnosis not present

## 2023-01-23 DIAGNOSIS — Z87891 Personal history of nicotine dependence: Secondary | ICD-10-CM | POA: Diagnosis not present

## 2023-01-23 DIAGNOSIS — C7951 Secondary malignant neoplasm of bone: Secondary | ICD-10-CM | POA: Diagnosis not present

## 2023-01-23 DIAGNOSIS — Z7985 Long-term (current) use of injectable non-insulin antidiabetic drugs: Secondary | ICD-10-CM | POA: Diagnosis not present

## 2023-01-23 DIAGNOSIS — I10 Essential (primary) hypertension: Secondary | ICD-10-CM | POA: Diagnosis not present

## 2023-01-23 DIAGNOSIS — E119 Type 2 diabetes mellitus without complications: Secondary | ICD-10-CM | POA: Diagnosis not present

## 2023-01-23 DIAGNOSIS — I2510 Atherosclerotic heart disease of native coronary artery without angina pectoris: Secondary | ICD-10-CM | POA: Diagnosis not present

## 2023-01-23 DIAGNOSIS — I4729 Other ventricular tachycardia: Secondary | ICD-10-CM | POA: Diagnosis not present

## 2023-01-23 DIAGNOSIS — Z7984 Long term (current) use of oral hypoglycemic drugs: Secondary | ICD-10-CM | POA: Diagnosis not present

## 2023-01-23 DIAGNOSIS — I442 Atrioventricular block, complete: Secondary | ICD-10-CM | POA: Diagnosis not present

## 2023-01-23 DIAGNOSIS — Z79899 Other long term (current) drug therapy: Secondary | ICD-10-CM | POA: Diagnosis not present

## 2023-02-08 DIAGNOSIS — M79671 Pain in right foot: Secondary | ICD-10-CM | POA: Diagnosis not present

## 2023-02-08 DIAGNOSIS — E1140 Type 2 diabetes mellitus with diabetic neuropathy, unspecified: Secondary | ICD-10-CM | POA: Diagnosis not present

## 2023-02-08 DIAGNOSIS — M79672 Pain in left foot: Secondary | ICD-10-CM | POA: Diagnosis not present

## 2023-02-08 DIAGNOSIS — E1151 Type 2 diabetes mellitus with diabetic peripheral angiopathy without gangrene: Secondary | ICD-10-CM | POA: Diagnosis not present

## 2023-02-08 DIAGNOSIS — L110 Acquired keratosis follicularis: Secondary | ICD-10-CM | POA: Diagnosis not present

## 2023-02-08 DIAGNOSIS — M79674 Pain in right toe(s): Secondary | ICD-10-CM | POA: Diagnosis not present

## 2023-02-08 DIAGNOSIS — M79675 Pain in left toe(s): Secondary | ICD-10-CM | POA: Diagnosis not present

## 2023-02-27 DIAGNOSIS — C7951 Secondary malignant neoplasm of bone: Secondary | ICD-10-CM | POA: Diagnosis not present

## 2023-02-27 DIAGNOSIS — N1831 Chronic kidney disease, stage 3a: Secondary | ICD-10-CM | POA: Diagnosis not present

## 2023-02-27 DIAGNOSIS — E7849 Other hyperlipidemia: Secondary | ICD-10-CM | POA: Diagnosis not present

## 2023-02-27 DIAGNOSIS — E1122 Type 2 diabetes mellitus with diabetic chronic kidney disease: Secondary | ICD-10-CM | POA: Diagnosis not present

## 2023-02-27 DIAGNOSIS — E1165 Type 2 diabetes mellitus with hyperglycemia: Secondary | ICD-10-CM | POA: Diagnosis not present

## 2023-03-06 DIAGNOSIS — Z23 Encounter for immunization: Secondary | ICD-10-CM | POA: Diagnosis not present

## 2023-03-06 DIAGNOSIS — R197 Diarrhea, unspecified: Secondary | ICD-10-CM | POA: Diagnosis not present

## 2023-03-06 DIAGNOSIS — R251 Tremor, unspecified: Secondary | ICD-10-CM | POA: Diagnosis not present

## 2023-03-06 DIAGNOSIS — E1165 Type 2 diabetes mellitus with hyperglycemia: Secondary | ICD-10-CM | POA: Diagnosis not present

## 2023-03-06 DIAGNOSIS — C7951 Secondary malignant neoplasm of bone: Secondary | ICD-10-CM | POA: Diagnosis not present

## 2023-03-06 DIAGNOSIS — R2681 Unsteadiness on feet: Secondary | ICD-10-CM | POA: Diagnosis not present

## 2023-03-06 DIAGNOSIS — N1831 Chronic kidney disease, stage 3a: Secondary | ICD-10-CM | POA: Diagnosis not present

## 2023-03-06 DIAGNOSIS — I10 Essential (primary) hypertension: Secondary | ICD-10-CM | POA: Diagnosis not present

## 2023-03-06 DIAGNOSIS — R739 Hyperglycemia, unspecified: Secondary | ICD-10-CM | POA: Diagnosis not present

## 2023-03-06 DIAGNOSIS — L989 Disorder of the skin and subcutaneous tissue, unspecified: Secondary | ICD-10-CM | POA: Diagnosis not present

## 2023-03-23 DIAGNOSIS — Z45018 Encounter for adjustment and management of other part of cardiac pacemaker: Secondary | ICD-10-CM | POA: Diagnosis not present

## 2023-03-23 DIAGNOSIS — Z950 Presence of cardiac pacemaker: Secondary | ICD-10-CM | POA: Diagnosis not present

## 2023-04-19 DIAGNOSIS — R61 Generalized hyperhidrosis: Secondary | ICD-10-CM | POA: Diagnosis not present

## 2023-04-19 DIAGNOSIS — R232 Flushing: Secondary | ICD-10-CM | POA: Diagnosis not present

## 2023-04-19 DIAGNOSIS — R5383 Other fatigue: Secondary | ICD-10-CM | POA: Diagnosis not present

## 2023-04-19 DIAGNOSIS — C7951 Secondary malignant neoplasm of bone: Secondary | ICD-10-CM | POA: Diagnosis not present

## 2023-05-03 DIAGNOSIS — M79671 Pain in right foot: Secondary | ICD-10-CM | POA: Diagnosis not present

## 2023-05-03 DIAGNOSIS — E1151 Type 2 diabetes mellitus with diabetic peripheral angiopathy without gangrene: Secondary | ICD-10-CM | POA: Diagnosis not present

## 2023-05-03 DIAGNOSIS — M545 Low back pain, unspecified: Secondary | ICD-10-CM | POA: Diagnosis not present

## 2023-05-03 DIAGNOSIS — M79672 Pain in left foot: Secondary | ICD-10-CM | POA: Diagnosis not present

## 2023-05-03 DIAGNOSIS — M79675 Pain in left toe(s): Secondary | ICD-10-CM | POA: Diagnosis not present

## 2023-05-03 DIAGNOSIS — L110 Acquired keratosis follicularis: Secondary | ICD-10-CM | POA: Diagnosis not present

## 2023-05-03 DIAGNOSIS — C7951 Secondary malignant neoplasm of bone: Secondary | ICD-10-CM | POA: Diagnosis not present

## 2023-05-03 DIAGNOSIS — M79674 Pain in right toe(s): Secondary | ICD-10-CM | POA: Diagnosis not present

## 2023-05-03 DIAGNOSIS — E1140 Type 2 diabetes mellitus with diabetic neuropathy, unspecified: Secondary | ICD-10-CM | POA: Diagnosis not present

## 2023-05-04 DIAGNOSIS — R2681 Unsteadiness on feet: Secondary | ICD-10-CM | POA: Diagnosis not present

## 2023-05-04 DIAGNOSIS — R0602 Shortness of breath: Secondary | ICD-10-CM | POA: Diagnosis not present

## 2023-05-04 DIAGNOSIS — R030 Elevated blood-pressure reading, without diagnosis of hypertension: Secondary | ICD-10-CM | POA: Diagnosis not present

## 2023-05-05 DIAGNOSIS — R300 Dysuria: Secondary | ICD-10-CM | POA: Diagnosis not present

## 2023-05-10 DIAGNOSIS — C7951 Secondary malignant neoplasm of bone: Secondary | ICD-10-CM | POA: Diagnosis not present

## 2023-05-10 DIAGNOSIS — M4807 Spinal stenosis, lumbosacral region: Secondary | ICD-10-CM | POA: Diagnosis not present

## 2023-05-10 DIAGNOSIS — M48061 Spinal stenosis, lumbar region without neurogenic claudication: Secondary | ICD-10-CM | POA: Diagnosis not present

## 2023-05-17 DIAGNOSIS — C7951 Secondary malignant neoplasm of bone: Secondary | ICD-10-CM | POA: Diagnosis not present

## 2023-06-02 DIAGNOSIS — Z0001 Encounter for general adult medical examination with abnormal findings: Secondary | ICD-10-CM | POA: Diagnosis not present

## 2023-06-02 DIAGNOSIS — Z1389 Encounter for screening for other disorder: Secondary | ICD-10-CM | POA: Diagnosis not present

## 2023-06-02 DIAGNOSIS — Z9189 Other specified personal risk factors, not elsewhere classified: Secondary | ICD-10-CM | POA: Diagnosis not present

## 2023-06-06 DIAGNOSIS — I10 Essential (primary) hypertension: Secondary | ICD-10-CM | POA: Diagnosis not present

## 2023-06-06 DIAGNOSIS — Z9221 Personal history of antineoplastic chemotherapy: Secondary | ICD-10-CM | POA: Diagnosis not present

## 2023-06-06 DIAGNOSIS — Z792 Long term (current) use of antibiotics: Secondary | ICD-10-CM | POA: Diagnosis not present

## 2023-06-06 DIAGNOSIS — K611 Rectal abscess: Secondary | ICD-10-CM | POA: Diagnosis not present

## 2023-06-06 DIAGNOSIS — J439 Emphysema, unspecified: Secondary | ICD-10-CM | POA: Diagnosis not present

## 2023-06-06 DIAGNOSIS — R339 Retention of urine, unspecified: Secondary | ICD-10-CM | POA: Diagnosis not present

## 2023-06-06 DIAGNOSIS — B9562 Methicillin resistant Staphylococcus aureus infection as the cause of diseases classified elsewhere: Secondary | ICD-10-CM | POA: Diagnosis not present

## 2023-06-06 DIAGNOSIS — I2510 Atherosclerotic heart disease of native coronary artery without angina pectoris: Secondary | ICD-10-CM | POA: Diagnosis not present

## 2023-06-06 DIAGNOSIS — L0231 Cutaneous abscess of buttock: Secondary | ICD-10-CM | POA: Diagnosis not present

## 2023-06-06 DIAGNOSIS — Z7984 Long term (current) use of oral hypoglycemic drugs: Secondary | ICD-10-CM | POA: Diagnosis not present

## 2023-06-06 DIAGNOSIS — K5900 Constipation, unspecified: Secondary | ICD-10-CM | POA: Diagnosis not present

## 2023-06-06 DIAGNOSIS — C7951 Secondary malignant neoplasm of bone: Secondary | ICD-10-CM | POA: Diagnosis not present

## 2023-06-06 DIAGNOSIS — R739 Hyperglycemia, unspecified: Secondary | ICD-10-CM | POA: Diagnosis not present

## 2023-06-06 DIAGNOSIS — Z960 Presence of urogenital implants: Secondary | ICD-10-CM | POA: Diagnosis not present

## 2023-06-06 DIAGNOSIS — Z79891 Long term (current) use of opiate analgesic: Secondary | ICD-10-CM | POA: Diagnosis not present

## 2023-06-06 DIAGNOSIS — E119 Type 2 diabetes mellitus without complications: Secondary | ICD-10-CM | POA: Diagnosis not present

## 2023-06-06 DIAGNOSIS — N1330 Unspecified hydronephrosis: Secondary | ICD-10-CM | POA: Diagnosis not present

## 2023-06-06 DIAGNOSIS — R7989 Other specified abnormal findings of blood chemistry: Secondary | ICD-10-CM | POA: Diagnosis not present

## 2023-06-06 DIAGNOSIS — I77810 Thoracic aortic ectasia: Secondary | ICD-10-CM | POA: Diagnosis not present

## 2023-06-06 DIAGNOSIS — Z604 Social exclusion and rejection: Secondary | ICD-10-CM | POA: Diagnosis not present

## 2023-06-06 DIAGNOSIS — Z87891 Personal history of nicotine dependence: Secondary | ICD-10-CM | POA: Diagnosis not present

## 2023-06-06 DIAGNOSIS — Z950 Presence of cardiac pacemaker: Secondary | ICD-10-CM | POA: Diagnosis not present

## 2023-06-06 DIAGNOSIS — Z794 Long term (current) use of insulin: Secondary | ICD-10-CM | POA: Diagnosis not present

## 2023-06-06 DIAGNOSIS — R338 Other retention of urine: Secondary | ICD-10-CM | POA: Diagnosis not present

## 2023-06-06 DIAGNOSIS — R531 Weakness: Secondary | ICD-10-CM | POA: Diagnosis not present

## 2023-06-06 DIAGNOSIS — K5909 Other constipation: Secondary | ICD-10-CM | POA: Diagnosis not present

## 2023-06-06 DIAGNOSIS — Z79899 Other long term (current) drug therapy: Secondary | ICD-10-CM | POA: Diagnosis not present

## 2023-06-06 DIAGNOSIS — N281 Cyst of kidney, acquired: Secondary | ICD-10-CM | POA: Diagnosis not present

## 2023-06-06 DIAGNOSIS — R0602 Shortness of breath: Secondary | ICD-10-CM | POA: Diagnosis not present

## 2023-06-06 DIAGNOSIS — Z7985 Long-term (current) use of injectable non-insulin antidiabetic drugs: Secondary | ICD-10-CM | POA: Diagnosis not present

## 2023-06-12 ENCOUNTER — Telehealth: Payer: Self-pay | Admitting: Urology

## 2023-06-12 NOTE — Telephone Encounter (Signed)
 Patient was in [facility] , had several test done.  He has enlarged prostate they placed a cath.  Daughter wants to leave the cath in because pt is not mobile and it is too hard for them to get him in. Will you look at the Ct scan that he done at [facility] and call them ?

## 2023-06-12 NOTE — Telephone Encounter (Signed)
 See below

## 2023-06-16 ENCOUNTER — Ambulatory Visit: Admitting: Urology

## 2023-06-22 DIAGNOSIS — K611 Rectal abscess: Secondary | ICD-10-CM | POA: Diagnosis not present

## 2023-06-22 DIAGNOSIS — Z923 Personal history of irradiation: Secondary | ICD-10-CM | POA: Diagnosis not present

## 2023-06-22 DIAGNOSIS — Z45018 Encounter for adjustment and management of other part of cardiac pacemaker: Secondary | ICD-10-CM | POA: Diagnosis not present

## 2023-06-22 DIAGNOSIS — J9601 Acute respiratory failure with hypoxia: Secondary | ICD-10-CM | POA: Diagnosis not present

## 2023-06-22 DIAGNOSIS — I480 Paroxysmal atrial fibrillation: Secondary | ICD-10-CM | POA: Diagnosis not present

## 2023-06-22 DIAGNOSIS — R001 Bradycardia, unspecified: Secondary | ICD-10-CM | POA: Diagnosis not present

## 2023-06-22 DIAGNOSIS — C7982 Secondary malignant neoplasm of genital organs: Secondary | ICD-10-CM | POA: Diagnosis not present

## 2023-06-22 DIAGNOSIS — Z950 Presence of cardiac pacemaker: Secondary | ICD-10-CM | POA: Diagnosis not present

## 2023-06-22 DIAGNOSIS — R339 Retention of urine, unspecified: Secondary | ICD-10-CM | POA: Diagnosis not present

## 2023-06-22 DIAGNOSIS — I442 Atrioventricular block, complete: Secondary | ICD-10-CM | POA: Diagnosis not present

## 2023-06-22 DIAGNOSIS — I10 Essential (primary) hypertension: Secondary | ICD-10-CM | POA: Diagnosis not present

## 2023-06-22 DIAGNOSIS — C7951 Secondary malignant neoplasm of bone: Secondary | ICD-10-CM | POA: Diagnosis not present

## 2023-06-22 DIAGNOSIS — E119 Type 2 diabetes mellitus without complications: Secondary | ICD-10-CM | POA: Diagnosis not present

## 2023-06-23 DIAGNOSIS — K611 Rectal abscess: Secondary | ICD-10-CM | POA: Diagnosis not present

## 2023-07-04 NOTE — Telephone Encounter (Signed)
 Please see Dr. Mozell Arias response.  Patient daughter cancelled 4/11 appointment on 4/10 with cancellation reason  (per daughter they have to go another route the cath at the moment [facility])

## 2023-08-06 DEATH — deceased
